# Patient Record
Sex: Female | Born: 1955
Health system: Southern US, Community
[De-identification: ages and names within clinical notes are randomized; demographics above are authoritative.]

## PROBLEM LIST (undated history)

## (undated) DIAGNOSIS — F32A Depression, unspecified: Secondary | ICD-10-CM

## (undated) DIAGNOSIS — L0390 Cellulitis, unspecified: Secondary | ICD-10-CM

## (undated) DIAGNOSIS — E7800 Pure hypercholesterolemia, unspecified: Secondary | ICD-10-CM

## (undated) DIAGNOSIS — R569 Unspecified convulsions: Secondary | ICD-10-CM

## (undated) DIAGNOSIS — G4730 Sleep apnea, unspecified: Secondary | ICD-10-CM

## (undated) DIAGNOSIS — J449 Chronic obstructive pulmonary disease, unspecified: Secondary | ICD-10-CM

## (undated) DIAGNOSIS — H548 Legal blindness, as defined in USA: Secondary | ICD-10-CM

## (undated) DIAGNOSIS — D249 Benign neoplasm of unspecified breast: Secondary | ICD-10-CM

## (undated) DIAGNOSIS — M5020 Other cervical disc displacement, unspecified cervical region: Secondary | ICD-10-CM

## (undated) DIAGNOSIS — F329 Major depressive disorder, single episode, unspecified: Secondary | ICD-10-CM

## (undated) DIAGNOSIS — F1911 Other psychoactive substance abuse, in remission: Secondary | ICD-10-CM

## (undated) DIAGNOSIS — R51 Headache: Principal | ICD-10-CM

## (undated) HISTORY — PX: BREAST EXCISIONAL BIOPSY: SUR124

## (undated) HISTORY — DX: Major depressive disorder, single episode, unspecified: F32.9

## (undated) HISTORY — DX: Cellulitis, unspecified: L03.90

## (undated) HISTORY — DX: Legal blindness, as defined in USA: H54.8

## (undated) HISTORY — DX: Headache: R51

## (undated) HISTORY — DX: Other cervical disc displacement, unspecified cervical region: M50.20

## (undated) HISTORY — DX: Benign neoplasm of unspecified breast: D24.9

## (undated) HISTORY — DX: Other psychoactive substance abuse, in remission: F19.11

## (undated) HISTORY — PX: BREAST FIBROADENOMA SURGERY: SHX580

## (undated) HISTORY — PX: TUBAL LIGATION: SHX77

## (undated) HISTORY — DX: Depression, unspecified: F32.A

## (undated) HISTORY — PX: NECK SURGERY: SHX720

---

## 1996-05-06 DIAGNOSIS — M5020 Other cervical disc displacement, unspecified cervical region: Secondary | ICD-10-CM

## 1996-05-06 HISTORY — DX: Other cervical disc displacement, unspecified cervical region: M50.20

## 1999-05-07 DIAGNOSIS — F1911 Other psychoactive substance abuse, in remission: Secondary | ICD-10-CM

## 1999-05-07 HISTORY — DX: Other psychoactive substance abuse, in remission: F19.11

## 2013-09-17 ENCOUNTER — Ambulatory Visit: Payer: Medicare Other | Admitting: Neurology

## 2013-10-05 ENCOUNTER — Encounter: Payer: Self-pay | Admitting: Neurology

## 2013-10-07 ENCOUNTER — Encounter: Payer: Self-pay | Admitting: Neurology

## 2013-10-07 ENCOUNTER — Ambulatory Visit (INDEPENDENT_AMBULATORY_CARE_PROVIDER_SITE_OTHER): Payer: Medicare Other | Admitting: Neurology

## 2013-10-07 ENCOUNTER — Encounter (INDEPENDENT_AMBULATORY_CARE_PROVIDER_SITE_OTHER): Payer: Self-pay

## 2013-10-07 VITALS — BP 111/72 | HR 96 | Ht 63.0 in | Wt 140.0 lb

## 2013-10-07 DIAGNOSIS — R51 Headache: Secondary | ICD-10-CM

## 2013-10-07 DIAGNOSIS — G4486 Cervicogenic headache: Secondary | ICD-10-CM

## 2013-10-07 HISTORY — DX: Cervicogenic headache: G44.86

## 2013-10-07 MED ORDER — GABAPENTIN 300 MG PO CAPS: ORAL_CAPSULE | ORAL | Status: DC

## 2013-10-07 NOTE — Progress Notes (Signed)
Reason for visit: Headache  Latoya Poole is a 58 y.o. female  History of present illness:  Latoya Poole is a 58 year old right-handed white female with a history of chronic daily headaches that have been present for greater than 20 years. She indicates that the headaches are left occipital in nature spreading more anteriorly in the head. The headaches are associated with a sharp or a throbbing pain. She reports that she has significant neck discomfort on the left side, without pain into the shoulder or down the arm. She has some neck stiffness and crepitus in the neck. She reports no weakness of the extremities, numbness of the extremities, balance problems, or problems controlling the bowels or the bladder. With the headache, there is no nausea or vomiting, dizziness, or visual changes. She has in the past taken excessive amounts of BC powders, but she suffered from aspirin toxicity on this. Topamax is not well-tolerated, and results in nausea and decreased appetite. The Topamax does have some beneficial effect with the headache. She has also been on Cymbalta taking 60 mg daily for number of years. She currently is not taking anything for the headache. She is sent to this office for an evaluation.  Past Medical History  Diagnosis Date  . Depressive disorder   . Cellulitis   . Herniated disc, cervical 1998  . Breast fibroadenoma     left  . 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 H/O: substance abuse 2001    xanax  . Cervicogenic headache 10/07/2013    Past Surgical History  Procedure Laterality Date  . Breast fibroadenoma surgery    . Neck surgery    . Tubal ligation Bilateral     Family History  Problem Relation Age of Onset  . Cancer Mother   . Cirrhosis Father     Social history:  reports that she has been smoking Cigarettes.  She has a 30 pack-year smoking history. She has never used smokeless tobacco. She reports that she does not drink  alcohol or use illicit drugs.  Medications:  Current Outpatient Prescriptions on File Prior to Visit  Medication Sig Dispense Refill  . diazepam (VALIUM) 10 MG tablet Take 10 mg by mouth every 8 (eight) hours as needed for anxiety.      . DULoxetine (CYMBALTA) 60 MG capsule Take 60 mg by mouth daily.      . rosuvastatin (CRESTOR) 10 MG tablet Take 10 mg by mouth daily.      . traZODone (DESYREL) 150 MG tablet Take 150 mg by mouth at bedtime.       No current facility-administered medications on file prior to visit.      Allergies  Allergen Reactions  . Sulfa Antibiotics Hives    ROS:  Out of a complete 14 system review of symptoms, the patient complains only of the following symptoms, and all other reviewed systems are negative.  Weight loss Skin rash Easy bruising Headache Depression, anxiety, insomnia  Blood pressure 111/72, pulse 96, height 5\' 3"  (1.6 m), weight 140 lb (63.504 kg).  Physical Exam  General: The patient is alert and cooperative at the time of the examination.  Eyes: Pupils are anisocoric, 6-7 mm, trace reactive on the right, 3-4 mm, reactive on the left. Discs are flat bilaterally.  Neck: The neck is supple, no carotid bruits are noted.  Respiratory: The respiratory examination is clear.  Cardiovascular: The cardiovascular examination reveals a regular  rate and rhythm, no obvious murmurs or rubs are noted.  Neuromuscular: Range of movement of the cervical spine is reduced by 30 with turning head to the left, 20 with turning the head to the right.  Skin: Extremities are without significant edema.  Neurologic Exam  Mental status: The patient is alert and oriented x 3 at the time of the examination. The patient has apparent normal recent and remote memory, with an apparently normal attention span and concentration ability.  Cranial nerves: Facial symmetry is present. There is good sensation of the face to pinprick and soft touch bilaterally. The  strength of the facial muscles and the muscles to head turning and shoulder shrug are normal bilaterally. Speech is well enunciated, no aphasia or dysarthria is noted. Extraocular movements are full. Visual fields are full, but vision is decreased in the right eye.. The tongue is midline, and the patient has symmetric elevation of the soft palate. No obvious hearing deficits are noted.  Motor: The motor testing reveals 5 over 5 strength of all 4 extremities. Good symmetric motor tone is noted throughout.  Sensory: Sensory testing is intact to pinprick, soft touch, vibration sensation, and position sense on all 4 extremities. No evidence of extinction is noted.  Coordination: Cerebellar testing reveals good finger-nose-finger and heel-to-shin bilaterally.  Gait and station: Gait is normal. Tandem gait is normal. Romberg is negative. No drift is seen.  Reflexes: Deep tendon reflexes are symmetric and normal bilaterally. Toes are downgoing bilaterally.   Assessment/Plan:  1. Cervicogenic headache  The patient has a history of prior cervical spine surgery, and she has chronic daily headaches coming up from the left side of the head and neck. The patient will be sent for MRI evaluation of the cervical spine. She will be placed on gabapentin. In the future, the Cymbalta may be increased. The patient will be sent for physical therapy for neuromuscular therapy. The patient should develop a home regimen to potentiate the benefit from therapy. She will followup in about 3 or 4 months.  Jill Alexanders MD 10/07/2013 3:28 PM  [facility] [address] [place], [place] [address]  Phone [phone] Fax [phone]

## 2013-10-07 NOTE — Patient Instructions (Signed)

## 2013-10-14 ENCOUNTER — Ambulatory Visit
Admission: RE | Admit: 2013-10-14 | Discharge: 2013-10-14 | Disposition: A | Discharge status: Home / Self Care | Payer: Medicare Other | Source: Ambulatory Visit | Attending: Neurology | Admitting: Neurology

## 2013-10-14 DIAGNOSIS — G4486 Cervicogenic headache: Secondary | ICD-10-CM

## 2013-10-14 DIAGNOSIS — R51 Headache: Secondary | ICD-10-CM

## 2013-10-18 ENCOUNTER — Telehealth: Payer: Self-pay | Admitting: Neurology

## 2013-10-18 MED ORDER — PREGABALIN 50 MG PO CAPS: 50.0000 mg | ORAL_CAPSULE | Freq: Two times a day (BID) | ORAL | Status: DC

## 2013-10-18 NOTE — Telephone Encounter (Signed)
I called patient. The MRI of the cervical spine shows minimal degenerative changes, most prominent at the C5-6 level. No surgically amenable lesions. The patient is to engage in physical therapy, on gabapentin, Cymbalta can be increased in the future.   MRI cervical spine 10/17/13:  Impression   Abnormal MRI scan of the cervical spine showing mild disc  degenerative changes most prominent at C5-6 where there is loss of disc  height and mild subluxation but without frank compression.

## 2013-10-18 NOTE — Telephone Encounter (Signed)
Patient returning call to Dr. Jannifer Franklin regarding MRI results, also has a question about Neurontin medication, states it has been making her feet swell a lot and she can't get her shoes on. Patient is wondering if she can try a different medication. Please call and advise.

## 2013-10-18 NOTE — Telephone Encounter (Signed)
Pt calling stating that she missed Dr. Jannifer Franklin call concerning her MRI results and that she would like try another medication other than gabapentin, because pt states that this medication makes her feet swell a lot. Please advise

## 2013-10-18 NOTE — Telephone Encounter (Signed)
I called patient. The patient is getting excessive swelling in ankles on gabapentin. We will stop the medication, try low-dose Lyrica. The patient indicates that she was on Cymbalta 60 mg twice daily, she was cut back to just one a day. There was no increase in the headache with this change.

## 2013-10-19 ENCOUNTER — Telehealth: Payer: Self-pay | Admitting: Neurology

## 2013-10-19 NOTE — Telephone Encounter (Signed)
We have confirmation the pharmacy received this Rx yesterday at 4:10pm.  I called the pharmacy.  Spoke with Estill Bamberg.  She said they are in the middle of processing the Rx at this time.  I called the patient back.  She is aware.

## 2013-10-19 NOTE — Telephone Encounter (Signed)
Pt called because the Lyrica is not at the pharmacy please call

## 2013-10-22 ENCOUNTER — Telehealth: Payer: Self-pay | Admitting: Neurology

## 2013-10-22 MED ORDER — HYDROCHLOROTHIAZIDE 12.5 MG PO TABS: 12.5000 mg | ORAL_TABLET | Freq: Every day | ORAL | Status: DC

## 2013-10-22 NOTE — Telephone Encounter (Signed)
Pt called states that she is having swelling in her feet and legs, she wants to see if Dr. Jannifer Franklin would call her in something for the swelling before she starts the Lyrica. Please call pt back concerning this matter. Thanks

## 2013-10-22 NOTE — Telephone Encounter (Signed)
The patient actually is on Lyrica, and the swelling has continued. I will try a low dose of hydrochlorothiazide.

## 2013-10-22 NOTE — Telephone Encounter (Signed)
I called back.  The patient said she has discontinued Gabapentin, but has not started taking Lyrica yet due to excessive swelling in her feet and legs.  She would like to know if something could be prescribed to reduce swelling.  Please advise.  Thank you.

## 2013-10-25 ENCOUNTER — Telehealth: Payer: Self-pay | Admitting: Neurology

## 2013-10-25 MED ORDER — QUETIAPINE FUMARATE 50 MG PO TABS: 50.0000 mg | ORAL_TABLET | Freq: Every day | ORAL | Status: DC

## 2013-10-25 NOTE — Addendum Note (Signed)
Addended by: Margette Fast on: 10/25/2013 05:58 PM   Modules accepted: Orders

## 2013-10-25 NOTE — Telephone Encounter (Addendum)
The patient is still swelling on the Lyrica, we will stop the Lyrica and the hydrochlorothiazide, try Seroquel. The patient is unable to take doses of Cymbalta higher than 60 mg daily.

## 2013-10-25 NOTE — Telephone Encounter (Signed)
Pt called states she has been taking the medication hydrochlorothiazide (HYDRODIURIL) 12.5 MG tablet, states her feet and legs are still swollen. Pt would like for Dr. Jannifer Franklin to call her back concerning this matter. Thanks

## 2013-10-28 ENCOUNTER — Ambulatory Visit: Payer: Medicare Other | Attending: Neurology

## 2013-10-28 DIAGNOSIS — M542 Cervicalgia: Secondary | ICD-10-CM | POA: Diagnosis not present

## 2013-10-28 DIAGNOSIS — R293 Abnormal posture: Secondary | ICD-10-CM | POA: Diagnosis not present

## 2013-10-28 DIAGNOSIS — IMO0001 Reserved for inherently not codable concepts without codable children: Secondary | ICD-10-CM | POA: Diagnosis present

## 2013-11-09 ENCOUNTER — Telehealth: Payer: Self-pay | Admitting: Neurology

## 2013-11-09 NOTE — Telephone Encounter (Signed)
Patient questioning if PT a 1 x event?  PT has her scheduled for 3 visits.  Please all and advise

## 2013-11-10 ENCOUNTER — Ambulatory Visit: Payer: Medicare Other | Attending: Neurology

## 2013-11-10 DIAGNOSIS — R293 Abnormal posture: Secondary | ICD-10-CM | POA: Insufficient documentation

## 2013-11-10 DIAGNOSIS — IMO0001 Reserved for inherently not codable concepts without codable children: Secondary | ICD-10-CM | POA: Diagnosis not present

## 2013-11-10 DIAGNOSIS — M542 Cervicalgia: Secondary | ICD-10-CM | POA: Insufficient documentation

## 2013-11-10 NOTE — Telephone Encounter (Signed)
Called pt and left message for pt to call our office back.

## 2013-11-16 NOTE — Telephone Encounter (Signed)
Called pt and pt stated that she had already decided just to take the three PT visits. I advised the pt that if she has any other problems, questions or concerns to call the office. Pt verbalized understanding.

## 2013-11-17 ENCOUNTER — Ambulatory Visit: Payer: Medicare Other

## 2013-11-17 DIAGNOSIS — IMO0001 Reserved for inherently not codable concepts without codable children: Secondary | ICD-10-CM | POA: Diagnosis not present

## 2013-11-23 ENCOUNTER — Ambulatory Visit: Payer: Medicare Other | Admitting: Physical Therapy

## 2013-11-23 DIAGNOSIS — IMO0001 Reserved for inherently not codable concepts without codable children: Secondary | ICD-10-CM | POA: Diagnosis not present

## 2013-11-29 ENCOUNTER — Telehealth: Payer: Self-pay | Admitting: Neurology

## 2013-11-29 ENCOUNTER — Ambulatory Visit: Payer: Medicare Other | Admitting: Physical Therapy

## 2013-11-29 DIAGNOSIS — IMO0001 Reserved for inherently not codable concepts without codable children: Secondary | ICD-10-CM | POA: Diagnosis not present

## 2013-11-29 MED ORDER — TRAMADOL HCL 50 MG PO TABS: 50.0000 mg | ORAL_TABLET | Freq: Four times a day (QID) | ORAL | Status: DC | PRN

## 2013-11-29 MED ORDER — QUETIAPINE FUMARATE 100 MG PO TABS: 100.0000 mg | ORAL_TABLET | Freq: Every day | ORAL | Status: DC

## 2013-11-29 NOTE — Telephone Encounter (Signed)
FYI: Seroquel 50 mg not working she takes 1qhs. Also patient requesting rx for pain be sent to [facility]. She had PT today and her neck is hurting.

## 2013-11-29 NOTE — Telephone Encounter (Signed)
The patient is on 50 mg of Seroquel at night, we will increase this to 100 mg. The patient is getting some physical therapy. I will call in some Ultram to take if needed for pain.

## 2013-11-29 NOTE — Telephone Encounter (Signed)
Patient calling requesting prescription for muscle pain in her neck. She had physical therapy today and is in a lot of pain. She stated that Seroquel is not working for her. She use [facility] [phone]

## 2013-12-02 ENCOUNTER — Ambulatory Visit: Payer: Medicare Other | Admitting: Physical Therapy

## 2013-12-02 DIAGNOSIS — IMO0001 Reserved for inherently not codable concepts without codable children: Secondary | ICD-10-CM | POA: Diagnosis not present

## 2013-12-09 ENCOUNTER — Telehealth: Payer: Self-pay | Admitting: Neurology

## 2013-12-09 MED ORDER — DICLOFENAC SODIUM 25 MG PO TBEC: 25.0000 mg | DELAYED_RELEASE_TABLET | Freq: Two times a day (BID) | ORAL | Status: DC

## 2013-12-09 NOTE — Telephone Encounter (Signed)
Trial of voltaren , to be taken with food, not on empty stomach.

## 2013-12-09 NOTE — Telephone Encounter (Signed)
Patient calling to state that she has been to physical therapy and has been doing the neck exercises at home but she is still in pain, states that the pain was only on left side but now it is on right side, patient requesting something stronger than Ultram for relief. Please return call to patient and advise.

## 2013-12-09 NOTE — Telephone Encounter (Signed)
Noted  

## 2013-12-09 NOTE — Telephone Encounter (Signed)
I called the patient.  She is aware.

## 2013-12-09 NOTE — Telephone Encounter (Signed)
Patient does not have voicemail set up on mobile number so please leave voicemail only on home number.

## 2013-12-09 NOTE — Telephone Encounter (Signed)
Patient has been going to physical therapy and doing neck exercises. She is taking Ultram 50 mg q6 hours. She says it is not relieving her pain and she would like something stronger.Please advise.

## 2013-12-20 ENCOUNTER — Telehealth: Payer: Self-pay | Admitting: Neurology

## 2013-12-20 NOTE — Telephone Encounter (Signed)
Latoya Poole DOB January 24, 2056 is calling--patient has been taking Diclofenac since 12-09-13 for neck pain and headaches--medication is not helping the pain--please call and advise--thank you.

## 2013-12-22 MED ORDER — TIZANIDINE HCL 2 MG PO TABS: ORAL_TABLET | ORAL | Status: DC

## 2013-12-22 NOTE — Telephone Encounter (Signed)
Patient calling again to check on the status, says she would like some relief for the pain, please return call and advise.

## 2013-12-22 NOTE — Telephone Encounter (Signed)
I called patient. She is having increasing neck and back pain after picking up a 10 pound bag of sugar in the last week or 2. The patient indicates that she has not been able to gain good improvement with her current medications. Diclofenac did not help. I will try tizanidine as a muscle relaxant, massage in the past has helped her with physical therapy, but she is no longer in physical therapy. The patient could potentially benefit from a cervical epidural steroid injection, but she does not wish to do this at this point. I will try tizanidine at this time.

## 2013-12-28 ENCOUNTER — Telehealth: Payer: Self-pay | Admitting: Neurology

## 2013-12-28 MED ORDER — OMEPRAZOLE 20 MG PO CPDR: 20.0000 mg | DELAYED_RELEASE_CAPSULE | Freq: Every day | ORAL | Status: DC

## 2013-12-28 NOTE — Telephone Encounter (Signed)
Spoke to patient and she relayed that she continues to have neck pain and headaches.  The Ultram was making her nauseous, not able to eat and drink.  She stated that Dr. Brigitte Pulse had prescribed Prilosec in the past that was helpful, but she needs an appointment with him to get a refill.  Asked if Dr. Jannifer Franklin can prescribe this medicine.

## 2013-12-28 NOTE — Telephone Encounter (Signed)
I called patient. She wants our office to prescribe Prilosec. I will call in a prescription.

## 2013-12-31 ENCOUNTER — Telehealth: Payer: Self-pay | Admitting: Neurology

## 2013-12-31 ENCOUNTER — Encounter: Payer: Self-pay | Admitting: Neurology

## 2013-12-31 MED ORDER — HYDROCODONE-ACETAMINOPHEN 5-325 MG PO TABS: 1.0000 | ORAL_TABLET | Freq: Four times a day (QID) | ORAL | Status: DC | PRN

## 2013-12-31 NOTE — Telephone Encounter (Signed)
Zanaflex Tizanidine 4 mg   [phone]

## 2013-12-31 NOTE — Telephone Encounter (Signed)
Patient requesting a mild hydrocodone be prescribed for her migraines and her neck pain. She uses [facility]. Her best call back number is [phone].

## 2013-12-31 NOTE — Telephone Encounter (Signed)
Will call in a small prescription for hydrocodone, no more than 30 a month.

## 2013-12-31 NOTE — Telephone Encounter (Signed)
Patient requesting a mild hydrocodone be prescribed for her migraines and her neck pain. She uses [facility]. Her best call back number is [phone]

## 2014-01-03 ENCOUNTER — Telehealth: Payer: Self-pay | Admitting: *Deleted

## 2014-01-03 NOTE — Telephone Encounter (Signed)
Left message for patient to pick up medication at the front desk of office.  I relayed through VM that this medication cannot be called in to pharmacy and must be picked up at the office.

## 2014-01-03 NOTE — Telephone Encounter (Signed)
Pt calling asking for MRI C spine to be sent to Dr. Carloyn Manner in [place] fax [phone].

## 2014-01-05 ENCOUNTER — Telehealth: Payer: Self-pay | Admitting: Neurology

## 2014-01-05 NOTE — Telephone Encounter (Signed)
Patient requesting Rx for Hydrocodone

## 2014-01-17 ENCOUNTER — Telehealth: Payer: Self-pay | Admitting: Neurology

## 2014-01-17 DIAGNOSIS — M47812 Spondylosis without myelopathy or radiculopathy, cervical region: Secondary | ICD-10-CM

## 2014-01-17 NOTE — Telephone Encounter (Signed)
Patient requesting that a referral be sent to Dr. Carloyn Manner in [place], his office number is [phone].

## 2014-01-18 NOTE — Telephone Encounter (Signed)
Latoya Poole want a referral to Dr. Carloyn Manner in [place] (neurosurgeon). She wants him to look at her MRI and give a second opinion.

## 2014-01-18 NOTE — Telephone Encounter (Signed)
I called patient. The patient wishes to see Dr. Carloyn Manner in [place]. I indicated to her that the MRI the cervical spine shows evidence of spasm, but nothing that would require surgery. The patient just wants a second opinion regarding her MRI of the cervical spine. I'll get a referral set up.

## 2014-02-08 ENCOUNTER — Encounter: Payer: Self-pay | Admitting: Adult Health

## 2014-02-08 ENCOUNTER — Ambulatory Visit (INDEPENDENT_AMBULATORY_CARE_PROVIDER_SITE_OTHER): Payer: Medicare Other | Admitting: Adult Health

## 2014-02-08 ENCOUNTER — Encounter (INDEPENDENT_AMBULATORY_CARE_PROVIDER_SITE_OTHER): Payer: Self-pay

## 2014-02-08 VITALS — BP 116/75 | HR 89 | Ht 63.0 in | Wt 170.0 lb

## 2014-02-08 DIAGNOSIS — G4486 Cervicogenic headache: Secondary | ICD-10-CM

## 2014-02-08 DIAGNOSIS — R51 Headache: Secondary | ICD-10-CM

## 2014-02-08 MED ORDER — HYDROCODONE-ACETAMINOPHEN 5-325 MG PO TABS: 1.0000 | ORAL_TABLET | Freq: Four times a day (QID) | ORAL | Status: DC | PRN

## 2014-02-08 NOTE — Progress Notes (Signed)
I have read the note, and I agree with the clinical assessment and plan.  Josaiah Muhammed KEITH   

## 2014-02-08 NOTE — Progress Notes (Addendum)
PATIENT: Latoya Poole DOB: 08-Jan-1956  REASON FOR VISIT: follow up HISTORY FROM: patient  HISTORY OF PRESENT ILLNESS: Latoya Poole is a 58 year old female with a history of chronic daily headaches. She returns today for follow-up. She is currently taking Vicodin for headache and neck pain. She has tried tramadol, tizanidine and voltaren without benefit. She states that the Vicodin helps. She states that the neck pain really became a problem after she fell in December. Ex-Husband states that she has always had headaches since he has known her. She has been to neuromuscular therapy six times and had some benefit. She continues to do the exercises at home. She continues to take Seroquel, lyrica and Vicodin and that combination has given her the most relief. She continues to have daily neck pain but headaches have improved.                                       HISTORY 10/07/13 (CW): 58 year old right-handed white female with a history of chronic daily headaches that have been present for greater than 20 years. She indicates that the headaches are left occipital in nature spreading more anteriorly in the head. The headaches are associated with a sharp or a throbbing pain. She reports that she has significant neck discomfort on the left side, without pain into the shoulder or down the arm. She has some neck stiffness and crepitus in the neck. She reports no weakness of the extremities, numbness of the extremities, balance problems, or problems controlling the bowels or the bladder. With the headache, there is no nausea or vomiting, dizziness, or visual changes. She has in the past taken excessive amounts of BC powders, but she suffered from aspirin toxicity on this. Topamax is not well-tolerated, and results in nausea and decreased appetite. The Topamax does have some beneficial effect with the headache. She has also been on Cymbalta taking 60 mg daily for number of years. She currently is not taking anything  for the headache. She is sent to this office for an evaluation.  REVIEW OF SYSTEMS: Full 14 system review of systems performed and notable only for:  Constitutional: Appetite change  Eyes: Loss of vision Ear/Nose/Throat: N/A  Skin: N/A  Cardiovascular: N/A  Respiratory: N/A  Gastrointestinal: N/A  Genitourinary: N/A Hematology/Lymphatic: N/A  Endocrine: N/A Musculoskeletal: Aching muscles, neck pain, neck stiffness Allergy/Immunology: N/A  Neurological: Headache Psychiatric: Depression, nervous/anxious Sleep: Restless leg   ALLERGIES: Allergies  Allergen Reactions  . Gabapentin     Leg swelling  . Lyrica [Pregabalin]     Leg swelling  . Sulfa Antibiotics Hives    HOME MEDICATIONS: Outpatient Prescriptions Prior to Visit  Medication Sig Dispense Refill  . diazepam (VALIUM) 10 MG tablet Take 10 mg by mouth every 8 (eight) hours as needed for anxiety.      . DULoxetine (CYMBALTA) 60 MG capsule Take 60 mg by mouth daily.      Marland Kitchen HYDROcodone-acetaminophen (NORCO/VICODIN) 5-325 MG per tablet Take 1 tablet by mouth every 6 (six) hours as needed for moderate pain. Must last 28 days  30 tablet  0  . omeprazole (PRILOSEC) 20 MG capsule Take 1 capsule (20 mg total) by mouth daily.  30 capsule  5  . QUEtiapine (SEROQUEL) 100 MG tablet Take 1 tablet (100 mg total) by mouth at bedtime.  30 tablet  3  . rosuvastatin (CRESTOR) 10 MG  tablet Take 10 mg by mouth daily.      . traZODone (DESYREL) 150 MG tablet Take 150 mg by mouth at bedtime.      . diclofenac (VOLTAREN) 25 MG EC tablet Take 1 tablet (25 mg total) by mouth 2 (two) times daily.  30 tablet  0  . tiZANidine (ZANAFLEX) 2 MG tablet 1 tablet twice daily for 5 days, then take one tablet 3 times daily  90 tablet  2  . traMADol (ULTRAM) 50 MG tablet Take 1 tablet (50 mg total) by mouth every 6 (six) hours as needed. Must last 28 days  60 tablet  1   No facility-administered medications prior to visit.    PAST MEDICAL HISTORY: Past  Medical History  Diagnosis Date  . Depressive disorder   . Cellulitis   . Herniated disc, cervical 1998  . Breast fibroadenoma     left  . 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 H/O: substance abuse 2001    xanax  . Cervicogenic headache 10/07/2013    PAST SURGICAL HISTORY: Past Surgical History  Procedure Laterality Date  . Breast fibroadenoma surgery    . Neck surgery    . Tubal ligation Bilateral     FAMILY HISTORY: Family History  Problem Relation Age of Onset  . Cancer Mother   . Cirrhosis Father     SOCIAL HISTORY: History   Social History  . Marital Status: Divorced    Spouse Name: N/A    Number of Children: 2  . Years of Education: 11th   Occupational History  . disabiltiy    Social History Main Topics  . Smoking status: Current Every Day Smoker -- 1.00 packs/day for 30 years    Types: Cigarettes  . Smokeless tobacco: Never Used     Comment: Oct 01, 2013  . Alcohol Use: No     Comment: former alcohol abuse quit 2001  . Drug Use: No  . Sexual Activity: Not on file   Other Topics Concern  . Not on file   Social History Narrative   Patient lives at home with son.    Patient has 2 children.    Patient on disability.    Patient is right handed.    Patient is divorced.       PHYSICAL EXAM  Filed Vitals:   02/08/14 1434  BP: 116/75  Pulse: 89  Height: 5\' 3"  (1.6 m)  Weight: 170 lb (77.111 kg)   Body mass index is 30.12 kg/(m^2).  Generalized: Well developed, in no acute distress   Neurological examination  Mentation: Alert oriented to time, place, history taking. Follows all commands speech and language fluent Cranial nerve II-XII:  Left Pupil is equal round reactive to light. Blind in right eye and Pupil is irregularly and non reactive. Extraocular movements were full, visual field were full on confrontational test. Facial sensation and strength were normal. . Uvula tongue midline. Head turning and shoulder  shrug  were normal and symmetric. Motor: The motor testing reveals 5 over 5 strength of all 4 extremities. Good symmetric motor tone is noted throughout.  Sensory: Sensory testing is intact to soft touch on all 4 extremities. No evidence of extinction is noted.  Coordination: Cerebellar testing reveals good finger-nose-finger and heel-to-shin bilaterally.  Gait and station: Gait is normal. Tandem gait is normal. Romberg is negative. No drift is seen.  Reflexes: Deep tendon reflexes are symmetric and normal bilaterally.  Marland Kitchen  DIAGNOSTIC DATA (LABS, IMAGING, TESTING) - I reviewed patient records, labs, notes, testing and imaging myself where available.  ASSESSMENT AND PLAN 59 y.o. year old female  has a past medical history of Depressive disorder; Cellulitis; Herniated disc, cervical (1998); Breast fibroadenoma; Legally blind in right eye, as defined in [place]; H/O: substance abuse (2001); and Cervicogenic headache (10/07/2013). here with:  1. Cervicogenic headache  Patient's headache and neck pain have improved with Seroquel, lyrica and Vicodin. I will refill the Vicodin today. I have asked the patient to sign a narcotic agreement. I have when over the agreement with the patient and she agreed to the terms. If the patient's symptoms worsen or if she develops new symptoms then she should let us know. Otherwise the patient will follow-up in 3 months.    Ward Givens, MSN, NP-C 02/08/2014, 2:37 PM [facility] [address], [place], [place] [address] [phone]  Note: This document was prepared with digital dictation and possible smart phrase technology. Any transcriptional errors that result from this process are unintentional.

## 2014-02-08 NOTE — Patient Instructions (Signed)

## 2014-03-15 ENCOUNTER — Other Ambulatory Visit: Payer: Self-pay

## 2014-03-15 MED ORDER — PREGABALIN 50 MG PO CAPS: 50.0000 mg | ORAL_CAPSULE | Freq: Two times a day (BID) | ORAL | Status: DC

## 2014-03-15 NOTE — Telephone Encounter (Signed)
Rx signed and faxed.

## 2014-03-17 ENCOUNTER — Telehealth: Payer: Self-pay | Admitting: Neurology

## 2014-03-17 MED ORDER — QUETIAPINE FUMARATE 100 MG PO TABS: 100.0000 mg | ORAL_TABLET | Freq: Every day | ORAL | Status: DC

## 2014-03-17 NOTE — Telephone Encounter (Signed)
Lyrica was already faxed to the pharmacy (pleas see previous encounter).  Seroquel has now been sent.

## 2014-03-17 NOTE — Telephone Encounter (Signed)
Patient calling to request refills of Lyrica and Seroquel to be sent to her [facility], advised patient to call her pharmacy directly next time and they will contact us.

## 2014-03-23 ENCOUNTER — Encounter: Payer: Self-pay | Admitting: Neurology

## 2014-03-23 ENCOUNTER — Other Ambulatory Visit: Payer: Self-pay | Admitting: Adult Health

## 2014-03-23 MED ORDER — HYDROCODONE-ACETAMINOPHEN 5-325 MG PO TABS: 1.0000 | ORAL_TABLET | Freq: Four times a day (QID) | ORAL | Status: DC | PRN

## 2014-03-23 NOTE — Telephone Encounter (Addendum)
Pt needs a Rx for HYDROcodone-acetaminophen (NORCO/VICODIN) 5-325 MG per tablet. Please call and advise.

## 2014-03-23 NOTE — Telephone Encounter (Signed)
Request forwarded to provider for approval  

## 2014-03-24 NOTE — Telephone Encounter (Signed)
I called the patient to let them know their Rx for Hydrocodone was ready for pickup. Patient was instructed to bring Photo ID. 

## 2014-03-29 ENCOUNTER — Encounter: Payer: Self-pay | Admitting: Neurology

## 2014-04-22 NOTE — Telephone Encounter (Signed)
Patient was seen 02/03/14

## 2014-05-03 ENCOUNTER — Telehealth: Payer: Self-pay

## 2014-05-03 ENCOUNTER — Other Ambulatory Visit: Payer: Self-pay

## 2014-05-03 MED ORDER — HYDROCODONE-ACETAMINOPHEN 5-325 MG PO TABS: 1.0000 | ORAL_TABLET | Freq: Four times a day (QID) | ORAL | Status: DC | PRN

## 2014-05-03 NOTE — Telephone Encounter (Signed)
Request entered, forwarded to provider for approval.  

## 2014-05-03 NOTE — Telephone Encounter (Signed)
Hydrochlorothiazide was initiated by Dr. Jannifer Franklin due to swelling in the extremities from Lyrica. If she is no longer having swelling and is only taking the medication as needed then this medication may no longer be indicated. Hydrochlorothiazide will not help her neck pain. Please clarify with the patient. If she is no longer having swelling in her extremities and has not been taking the medication. Then she no longer needs it. Thanks.

## 2014-05-03 NOTE — Telephone Encounter (Signed)
Pharmacy is requesting refill on Hydrochlorothiazide.  Per phone note from 06/22, patient was to discontinue this med.  I called the patient.  Says she has continued to take this med on an as needed basis only.  She would like to continue this drug if provider is agreeable.  Says the swelling in her extremities has subsided, however, she is still having pain in her neck, and feels one side of her head is "swollen".  Last seen in Oct, has follow up in April.  Please advise.  Thank you.

## 2014-05-03 NOTE — Telephone Encounter (Signed)
I called the patent back.  Says she just realized she had her medications mixed up.  She mistook HCTZ with a different drug she was taking as needed.  Says she hasn't really had issues with swelling, but if they do arise, she will call us back and let us know.

## 2014-05-04 NOTE — Telephone Encounter (Signed)
I called the patient to let them know their Rx for Hydrocodone was ready for pickup. Patient was instructed to bring Photo ID.

## 2014-05-30 ENCOUNTER — Other Ambulatory Visit: Payer: Self-pay | Admitting: Neurology

## 2014-05-31 NOTE — Telephone Encounter (Signed)
Per note on 06/22, this med was discontinued.  I called patient at home, got no answer, no VM.  Called cell, got no answer.  Left message.

## 2014-06-01 NOTE — Telephone Encounter (Signed)
I spoke with patient who said she has discontinued this medication

## 2014-06-08 ENCOUNTER — Other Ambulatory Visit: Payer: Self-pay | Admitting: *Deleted

## 2014-06-08 ENCOUNTER — Telehealth: Payer: Self-pay

## 2014-06-08 MED ORDER — HYDROCODONE-ACETAMINOPHEN 5-325 MG PO TABS: 1.0000 | ORAL_TABLET | Freq: Four times a day (QID) | ORAL | Status: DC | PRN

## 2014-06-08 NOTE — Telephone Encounter (Signed)
Called patient and informed Rx ready for pick up at front desk. Patient verbalized understanding.  

## 2014-06-08 NOTE — Telephone Encounter (Signed)
Request entered, forwarded to provider for approval.  

## 2014-06-08 NOTE — Telephone Encounter (Signed)
Patient calling wanting a refill

## 2014-07-08 ENCOUNTER — Other Ambulatory Visit: Payer: Self-pay | Admitting: *Deleted

## 2014-07-08 NOTE — Telephone Encounter (Signed)
Patient is aware that physician is out of the office today.

## 2014-07-11 ENCOUNTER — Telehealth: Payer: Self-pay

## 2014-07-11 MED ORDER — HYDROCODONE-ACETAMINOPHEN 5-325 MG PO TABS: 1.0000 | ORAL_TABLET | Freq: Four times a day (QID) | ORAL | Status: DC | PRN

## 2014-07-11 NOTE — Telephone Encounter (Signed)
Called patient and informed Rx ready for pick up at front desk. Patient verbalized understanding.  

## 2014-07-28 ENCOUNTER — Other Ambulatory Visit: Payer: Self-pay | Admitting: Neurology

## 2014-07-28 NOTE — Telephone Encounter (Signed)
Per note on 08/25

## 2014-08-10 ENCOUNTER — Encounter: Payer: Self-pay | Admitting: Adult Health

## 2014-08-10 ENCOUNTER — Ambulatory Visit (INDEPENDENT_AMBULATORY_CARE_PROVIDER_SITE_OTHER): Payer: Medicare Other | Admitting: Adult Health

## 2014-08-10 VITALS — BP 155/88 | HR 81 | Ht 63.0 in | Wt 169.0 lb

## 2014-08-10 DIAGNOSIS — R51 Headache: Secondary | ICD-10-CM

## 2014-08-10 DIAGNOSIS — G4486 Cervicogenic headache: Secondary | ICD-10-CM

## 2014-08-10 DIAGNOSIS — Z5181 Encounter for therapeutic drug level monitoring: Secondary | ICD-10-CM | POA: Diagnosis not present

## 2014-08-10 MED ORDER — PREGABALIN 50 MG PO CAPS: 50.0000 mg | ORAL_CAPSULE | Freq: Every day | ORAL | Status: DC

## 2014-08-10 MED ORDER — HYDROCODONE-ACETAMINOPHEN 5-325 MG PO TABS: 1.0000 | ORAL_TABLET | Freq: Four times a day (QID) | ORAL | Status: DC | PRN

## 2014-08-10 NOTE — Patient Instructions (Signed)
We will slowly try to wean you off some of the medication. I will decrease lyrica to 1 tablet daily.  I will refill hydrocodone today. We will check a urine drug screen today.

## 2014-08-10 NOTE — Progress Notes (Signed)
PATIENT: Latoya Poole DOB: 1956/03/17  REASON FOR VISIT: follow up- headache, neck pain HISTORY FROM: patient  HISTORY OF PRESENT ILLNESS: Latoya Poole is a 59 year old female with a history of headaches and neck pain. She returns today for follow-up. The patient states that her headaches have improved. She states that she may have two dull headaches a month. She states that her neck pain has also significantly improved with the medication. She states that she can take the Seroquel Lyrica and Vicodin and that offers her significant benefit. The patient states that she continues to do her neck exercises at home. The patient denies any new neurological symptoms. Denies any new medical issues. She returns today for evaluation  HISTORY 02/08/14: Latoya Poole is a 59 year old female with a history of chronic daily headaches. She returns today for follow-up. She is currently taking Vicodin for headache and neck pain. She has tried tramadol, tizanidine and voltaren without benefit. She states that the Vicodin helps. She states that the neck pain really became a problem after she fell in December. Ex-Husband states that she has always had headaches since he has known her. She has been to neuromuscular therapy six times and had some benefit. She continues to do the exercises at home. She continues to take Seroquel, lyrica and Vicodin and that combination has given her the most relief. She continues to have daily neck pain but headaches have improved.    HISTORY 10/07/13 (CW): 59 year old right-handed white female with a history of chronic daily headaches that have been present for greater than 20 years. She indicates that the headaches are left occipital in nature spreading more anteriorly in the head. The headaches are associated with a sharp or a throbbing pain. She reports that she has significant neck discomfort on the left side, without pain into the shoulder or down the  arm. She has some neck stiffness and crepitus in the neck. She reports no weakness of the extremities, numbness of the extremities, balance problems, or problems controlling the bowels or the bladder. With the headache, there is no nausea or vomiting, dizziness, or visual changes. She has in the past taken excessive amounts of BC powders, but she suffered from aspirin toxicity on this. Topamax is not well-tolerated, and results in nausea and decreased appetite. The Topamax does have some beneficial effect with the headache. She has also been on Cymbalta taking 60 mg daily for number of years. She currently is not taking anything for the headache. She is sent to this office for an evaluation.  REVIEW OF SYSTEMS: Out of a complete 14 system review of symptoms, the patient complains only of the following symptoms, and all other reviewed systems are negative.  Cough, back pain, neck pain, neck stiffness, nervous/anxious  ALLERGIES: Allergies  Allergen Reactions  . Gabapentin     Leg swelling  . Lyrica [Pregabalin]     Leg swelling  . Sulfa Antibiotics Hives    HOME MEDICATIONS: Outpatient Prescriptions Prior to Visit  Medication Sig Dispense Refill  . diazepam (VALIUM) 10 MG tablet Take 10 mg by mouth every 8 (eight) hours as needed for anxiety.    . DULoxetine (CYMBALTA) 60 MG capsule Take 60 mg by mouth daily.    Marland Kitchen HYDROcodone-acetaminophen (NORCO/VICODIN) 5-325 MG per tablet Take 1 tablet by mouth every 6 (six) hours as needed for moderate pain. Must last 28 days 30 tablet 0  . omeprazole (PRILOSEC) 20 MG capsule TAKE 1 CAPSULE BY MOUTH DAILY. [place]  capsule 5  . pregabalin (LYRICA) 50 MG capsule Take 1 capsule (50 mg total) by mouth 2 (two) times daily. 60 capsule 5  . QUEtiapine (SEROQUEL) 100 MG tablet Take 1 tablet (100 mg total) by mouth at bedtime. 30 tablet 5  . rosuvastatin (CRESTOR) 10 MG tablet Take 10 mg by mouth daily.    . traZODone (DESYREL) 150 MG tablet Take 150 mg by mouth at  bedtime.     No facility-administered medications prior to visit.    PAST MEDICAL HISTORY: Past Medical History  Diagnosis Date  . Depressive disorder   . Cellulitis   . Herniated disc, cervical 1998  . Breast fibroadenoma     left  . 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 H/O: substance abuse 2001    xanax  . Cervicogenic headache 10/07/2013    PAST SURGICAL HISTORY: Past Surgical History  Procedure Laterality Date  . Breast fibroadenoma surgery    . Neck surgery    . Tubal ligation Bilateral     FAMILY HISTORY: Family History  Problem Relation Age of Onset  . Cancer Mother   . Cirrhosis Father     SOCIAL HISTORY: History   Social History  . Marital Status: Divorced    Spouse Name: N/A  . Number of Children: 2  . Years of Education: 11th   Occupational History  . disabiltiy    Social History Main Topics  . Smoking status: Current Every Day Smoker -- 1.00 packs/day for 30 years    Types: Cigarettes  . Smokeless tobacco: Never Used     Comment: Oct 01, 2013  . Alcohol Use: No     Comment: former alcohol abuse quit 2001  . Drug Use: No  . Sexual Activity: Not on file   Other Topics Concern  . Not on file   Social History Narrative   Patient lives at home with son.    Patient has 2 children.    Patient on disability.    Patient is right handed.    Patient is divorced.    Education 11 th .      PHYSICAL EXAM  Filed Vitals:   08/10/14 0931  BP: 155/88  Pulse: 81  Height: 5\' 3"  (1.6 m)  Weight: 169 lb (76.658 kg)   Body mass index is 29.94 kg/(m^2).  Generalized: Well developed, in no acute distress   Neurological examination  Mentation: Alert oriented to time, place, history taking. Follows all commands speech and language fluent Cranial nerve II-XII: left Pupil equal round reactive to light. Right pupil dilated due to previous injury. Extraocular movements were full, visual field were full on confrontational  test. Facial sensation and strength were normal. Uvula tongue midline. Head turning and shoulder shrug  were normal and symmetric. Motor: The motor testing reveals 5 over 5 strength of all 4 extremities. Good symmetric motor tone is noted throughout. Mild pain with range of motion of the neck. Sensory: Sensory testing is intact to soft touch on all 4 extremities. No evidence of extinction is noted.  Coordination: Cerebellar testing reveals good finger-nose-finger and heel-to-shin bilaterally.  Gait and station: Gait is normal. Tandem gait is normal. Romberg is negative. No drift is seen.  Reflexes: Deep tendon reflexes are symmetric and normal bilaterally.    DIAGNOSTIC DATA (LABS, IMAGING, TESTING) - I reviewed patient records, labs, notes, testing and imaging myself where available.     ASSESSMENT AND PLAN 59  y.o. year old female  has a past medical history of Depressive disorder; Cellulitis; Herniated disc, cervical (1998); Breast fibroadenoma; Legally blind in right eye, as defined in [place]; H/O: substance abuse (2001); and Cervicogenic headache (10/07/2013). here with:  1. Headache 2. Neck pain  The patient's headache and neck pain has improved with medication. She currently uses Lyrica, Seroquel and hydrocodone and this has controlled her pain. I have spoken with the patient about decreasing her medication and possibly weaning her off some of this medication in the future. The patient is amenable to this. Today I will decrease her Lyrica to 1 tablet daily. In the future we may be able to wean this medication off. The patient is on hydrocodone I will check a urine drug screen today. I have advised this patient that if her symptoms worsen or she develops new symptoms she should let us know otherwise she will follow-up in 6 months or sooner if needed.     Ward Givens, MSN, NP-C 08/10/2014, 9:50 AM [facility] [address] [place], [place] [address] [phone]  Note: This document was prepared with digital dictation and possible smart phrase technology. Any transcriptional errors that result from this process are unintentional.

## 2014-08-10 NOTE — Progress Notes (Signed)
I have read the note, and I agree with the clinical assessment and plan.  WILLIS,CHARLES KEITH   

## 2014-08-10 NOTE — Addendum Note (Signed)
Addended by: Trudie Buckler on: 08/10/2014 01:08 PM   Modules accepted: Orders

## 2014-08-16 LAB — COMPREHENSIVE DRUG ANALYSIS,UR: PDF: 0

## 2014-08-16 NOTE — Progress Notes (Signed)
Quick Note:  Called and spoke to patient and relayed normal labs. Patient understood. ______

## 2014-08-29 ENCOUNTER — Other Ambulatory Visit: Payer: Self-pay | Admitting: Neurology

## 2014-08-29 NOTE — Telephone Encounter (Signed)
Per note from 07/27

## 2014-09-14 ENCOUNTER — Other Ambulatory Visit: Payer: Self-pay | Admitting: Neurology

## 2014-09-14 MED ORDER — HYDROCODONE-ACETAMINOPHEN 5-325 MG PO TABS: 1.0000 | ORAL_TABLET | Freq: Four times a day (QID) | ORAL | Status: DC | PRN

## 2014-10-17 ENCOUNTER — Telehealth: Payer: Self-pay

## 2014-10-17 ENCOUNTER — Other Ambulatory Visit: Payer: Self-pay | Admitting: Neurology

## 2014-10-17 MED ORDER — HYDROCODONE-ACETAMINOPHEN 5-325 MG PO TABS: 1.0000 | ORAL_TABLET | Freq: Four times a day (QID) | ORAL | Status: DC | PRN

## 2014-10-17 NOTE — Telephone Encounter (Signed)
Patient called and requested a refill on Rx. HYDROcodone-acetaminophen (NORCO/VICODIN) 5-325 MG per tablet. Informed the patient that it would be ready within 24 hours unless informed otherwise by the nurse.

## 2014-10-17 NOTE — Telephone Encounter (Signed)
Request entered, forwarded to provider for approval.  

## 2014-10-17 NOTE — Telephone Encounter (Signed)
Rx ready for pick up. 

## 2014-10-26 ENCOUNTER — Other Ambulatory Visit: Payer: Self-pay | Admitting: Neurology

## 2014-11-15 ENCOUNTER — Other Ambulatory Visit: Payer: Self-pay | Admitting: Neurology

## 2014-11-15 ENCOUNTER — Telehealth: Payer: Self-pay

## 2014-11-15 MED ORDER — HYDROCODONE-ACETAMINOPHEN 5-325 MG PO TABS: 1.0000 | ORAL_TABLET | Freq: Four times a day (QID) | ORAL | Status: DC | PRN

## 2014-11-15 NOTE — Telephone Encounter (Signed)
Pt needs Rx refill HYDROcodone-acetaminophen (NORCO/VICODIN) 5-325 MG per tablet, may reach pt at [phone]. Does not have voicemail.

## 2014-11-15 NOTE — Telephone Encounter (Signed)
Rx ready for pick up. 

## 2014-11-15 NOTE — Telephone Encounter (Signed)
Request entered, forwarded to provider for approval.  

## 2014-12-15 ENCOUNTER — Telehealth: Payer: Self-pay

## 2014-12-15 NOTE — Telephone Encounter (Signed)
I called the patient at home.  Line rang several times with no answer, no option to leave message.  I called cell.  Got no answer.  Left message.  Provider would like to verify the type of pain patient wants to use cream for.

## 2014-12-15 NOTE — Telephone Encounter (Signed)
Latoya Poole, pharmacist with [facility], would like to know if we will prescribe a pain cream for Latoya Poole.  Requesting: Lidocaine Oint 5%, Gabapentin 6% and Diclofenac 3%; 240 grams per fill, apply 1-2 grams to the affected area(s) three to four times daily.  Please advise.  Thank you.

## 2014-12-15 NOTE — Telephone Encounter (Signed)
Can you verify what type of pain she wants to use the cream for? I see her for cervicogenic headaches. Does she want to use the cream on her neck? If you can please call the patient and clarify

## 2014-12-19 ENCOUNTER — Other Ambulatory Visit: Payer: Self-pay | Admitting: Neurology

## 2014-12-19 MED ORDER — NONFORMULARY OR COMPOUNDED ITEM: Status: DC

## 2014-12-19 MED ORDER — HYDROCODONE-ACETAMINOPHEN 5-325 MG PO TABS: 1.0000 | ORAL_TABLET | Freq: Four times a day (QID) | ORAL | Status: DC | PRN

## 2014-12-19 NOTE — Telephone Encounter (Signed)
Pt called for rx refill on HYDROcodone-acetaminophen (NORCO/VICODIN) 5-325 MG per tablet.   Also states the cream is for her neck.   May call [phone]

## 2014-12-19 NOTE — Telephone Encounter (Signed)
I am amendable to this. I am unsure how to e-prescribe this?

## 2014-12-19 NOTE — Telephone Encounter (Signed)
Patient called back and said she would like to use the cream for pain she is having in her neck.

## 2014-12-19 NOTE — Telephone Encounter (Signed)
Rx has been added and sent it to the pharmacy.  I called the pharmacy and spoke with [facility].  He verified they will make this formulary, and they will notify the patient when it's ready for pick up.  I called the patient back as well, she is aware.

## 2014-12-20 ENCOUNTER — Telehealth: Payer: Self-pay

## 2014-12-20 NOTE — Telephone Encounter (Signed)
Rx ready for pick up. 

## 2015-01-18 ENCOUNTER — Other Ambulatory Visit: Payer: Self-pay | Admitting: Neurology

## 2015-01-18 NOTE — Telephone Encounter (Signed)
Per note on 08/25 

## 2015-01-30 ENCOUNTER — Telehealth: Payer: Self-pay | Admitting: Neurology

## 2015-01-30 NOTE — Telephone Encounter (Signed)
Patient is calling for a written Rx hydrocodone-acetaminophen 5/325 mg.  Thanks!

## 2015-01-31 ENCOUNTER — Other Ambulatory Visit: Payer: Self-pay | Admitting: Neurology

## 2015-01-31 ENCOUNTER — Telehealth: Payer: Self-pay

## 2015-01-31 MED ORDER — HYDROCODONE-ACETAMINOPHEN 5-325 MG PO TABS: 1.0000 | ORAL_TABLET | Freq: Four times a day (QID) | ORAL | Status: DC | PRN

## 2015-01-31 NOTE — Telephone Encounter (Signed)
Rx ready for pick up. 

## 2015-01-31 NOTE — Telephone Encounter (Signed)
A prescription was written for hydrocodone.

## 2015-02-14 ENCOUNTER — Ambulatory Visit (INDEPENDENT_AMBULATORY_CARE_PROVIDER_SITE_OTHER): Payer: Medicare Other | Admitting: Neurology

## 2015-02-14 ENCOUNTER — Encounter: Payer: Self-pay | Admitting: Neurology

## 2015-02-14 VITALS — BP 146/88 | HR 75 | Ht 63.0 in | Wt 166.0 lb

## 2015-02-14 DIAGNOSIS — G4486 Cervicogenic headache: Secondary | ICD-10-CM

## 2015-02-14 DIAGNOSIS — R51 Headache: Secondary | ICD-10-CM

## 2015-02-14 MED ORDER — PREGABALIN 50 MG PO CAPS: 50.0000 mg | ORAL_CAPSULE | Freq: Every day | ORAL | Status: DC

## 2015-02-14 MED ORDER — QUETIAPINE FUMARATE 100 MG PO TABS: 100.0000 mg | ORAL_TABLET | Freq: Every day | ORAL | Status: DC

## 2015-02-14 NOTE — Progress Notes (Signed)
Reason for visit: Headache  Latoya Poole is an 59 y.o. female  History of present illness:  Latoya Poole is a 59 year old right-handed white female with a history of cervical spondylosis and cervicogenic headache. The patient has done relatively well with the regimen of Lyrica and Seroquel that has allowed her to sleep, and this has helped her discomfort significant. The patient is not having any significant headaches during the month, and she is functioning fairly well. She notes that certain types of housework such as sweeping or mopping will worsen the pain. She denies any new symptoms since last seen. She is tolerating her medication regimen well. She returns for an evaluation.  Past Medical History  Diagnosis Date  . Depressive disorder   . Cellulitis   . Herniated disc, cervical 1998  . Breast fibroadenoma     left  . 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 H/O: substance abuse 2001    xanax  . Cervicogenic headache 10/07/2013    Past Surgical History  Procedure Laterality Date  . Breast fibroadenoma surgery    . Neck surgery    . Tubal ligation Bilateral     Family History  Problem Relation Age of Onset  . Cancer Mother   . Cirrhosis Father     Social history:  reports that she has been smoking Cigarettes.  She has a 30 pack-year smoking history. She has never used smokeless tobacco. She reports that she does not drink alcohol or use illicit drugs.    Allergies  Allergen Reactions  . Gabapentin     Leg swelling  . Lyrica [Pregabalin]     Leg swelling  . Sulfa Antibiotics Hives    Medications:  Prior to Admission medications   Medication Sig Start Date End Date Taking? Authorizing Provider  diazepam (VALIUM) 10 MG tablet Take 10 mg by mouth every 8 (eight) hours as needed for anxiety.   Yes Historical Provider, MD  DULoxetine (CYMBALTA) 60 MG capsule Take 60 mg by mouth daily.   Yes Historical Provider, MD    HYDROcodone-acetaminophen (NORCO/VICODIN) 5-325 MG per tablet Take 1 tablet by mouth every 6 (six) hours as needed for moderate pain. Must last 28 days 01/31/15  Yes Kathrynn Ducking, MD  NONFORMULARY OR COMPOUNDED ITEM Lidocaine Oint 5%, Gabapentin 6% and Diclofenac 3%; apply 1-2 grams to the affected area(s) three to four times daily 12/19/14  Yes Ward Givens, NP  omeprazole (PRILOSEC) 20 MG capsule TAKE 1 CAPSULE BY MOUTH DAILY. 01/18/15  Yes Kathrynn Ducking, MD  pregabalin (LYRICA) 50 MG capsule Take 1 capsule (50 mg total) by mouth daily. 08/10/14  Yes Ward Givens, NP  QUEtiapine (SEROQUEL) 100 MG tablet TAKE 1 TABLET BY MOUTH AT BEDTIME 10/26/14  Yes Kathrynn Ducking, MD  rosuvastatin (CRESTOR) 10 MG tablet Take 10 mg by mouth daily.   Yes Historical Provider, MD  traZODone (DESYREL) 150 MG tablet Take 150 mg by mouth at bedtime.   Yes Historical Provider, MD    ROS:  Out of a complete 14 system review of symptoms, the patient complains only of the following symptoms, and all other reviewed systems are negative.  Appetite change Blurred vision Bruising easily Headache Depression, anxiety Back pain, neck pain, neck stiffness  Blood pressure 146/88, pulse 75, height 5\' 3"  (1.6 m), weight 166 lb (75.297 kg).  Physical Exam  General: The patient is alert and cooperative at the time  of the examination.  Skin: No significant peripheral edema is noted.   Neurologic Exam  Mental status: The patient is alert and oriented x 3 at the time of the examination. The patient has apparent normal recent and remote memory, with an apparently normal attention span and concentration ability.   Cranial nerves: Facial symmetry is present. Speech is normal, no aphasia or dysarthria is noted. Extraocular movements are full. Visual fields are full.  Motor: The patient has good strength in all 4 extremities.  Sensory examination: Soft touch sensation is symmetric on the face, arms, and  legs.  Coordination: The patient has good finger-nose-finger and heel-to-shin bilaterally.  Gait and station: The patient has a normal gait. Tandem gait is normal. Romberg is negative. No drift is seen.  Reflexes: Deep tendon reflexes are symmetric.   Assessment/Plan:  1. Cervical spondylosis  2. Cervicogenic headache  The patient is doing quite well at this time, we will continue his current medication regimen, a prescription was given for the Seroquel and Lyrica. The patient will follow-up in 6 months, sooner if needed.  Jill Alexanders MD 02/14/2015 7:04 PM  [facility] [address] [place], [place] [address]  Phone [phone] Fax [phone]

## 2015-03-20 ENCOUNTER — Other Ambulatory Visit: Payer: Self-pay | Admitting: Neurology

## 2015-03-20 ENCOUNTER — Telehealth: Payer: Self-pay | Admitting: *Deleted

## 2015-03-20 MED ORDER — HYDROCODONE-ACETAMINOPHEN 5-325 MG PO TABS: 1.0000 | ORAL_TABLET | Freq: Four times a day (QID) | ORAL | Status: DC | PRN

## 2015-03-20 NOTE — Telephone Encounter (Signed)
Request entered, forwarded to provider for approval.  

## 2015-03-20 NOTE — Telephone Encounter (Signed)
Patient called to request refill of HYDROcodone-acetaminophen (NORCO/VICODIN) 5-325 MG per tablet °

## 2015-03-20 NOTE — Telephone Encounter (Signed)
Called pt. Rx hydrocodone-acetaminophen ready to be picked up in office. Rx placed up front. Pt verbalized understanding.

## 2015-04-19 ENCOUNTER — Telehealth: Payer: Self-pay

## 2015-04-19 ENCOUNTER — Other Ambulatory Visit: Payer: Self-pay | Admitting: Neurology

## 2015-04-19 MED ORDER — HYDROCODONE-ACETAMINOPHEN 5-325 MG PO TABS: 1.0000 | ORAL_TABLET | Freq: Four times a day (QID) | ORAL | Status: DC | PRN

## 2015-04-19 NOTE — Telephone Encounter (Signed)
Patient is calling to get a written Rx for HYDROcodone-acetaminophen (NORCO/VICODIN) 5-325 MG tablet. I advised the Rx will be ready in 24 hours unless otherwise advised by the nurse. Thank you.

## 2015-04-19 NOTE — Telephone Encounter (Signed)
Rx ready for pick up. 

## 2015-04-19 NOTE — Telephone Encounter (Signed)
Request entered, forwarded to provider for review.  

## 2015-05-22 ENCOUNTER — Other Ambulatory Visit: Payer: Self-pay | Admitting: Neurology

## 2015-05-22 MED ORDER — HYDROCODONE-ACETAMINOPHEN 5-325 MG PO TABS: 1.0000 | ORAL_TABLET | Freq: Four times a day (QID) | ORAL | Status: DC | PRN

## 2015-05-22 NOTE — Telephone Encounter (Signed)
Patient is calling to get a written Rx hydrocodone acetaminophen 5-325 mg tablets.  Thanks!

## 2015-05-23 ENCOUNTER — Telehealth: Payer: Self-pay

## 2015-05-23 NOTE — Telephone Encounter (Signed)
Rn call patient to let her know the hydrocodone rx is ready for pick up. Pt verbalized understanding.

## 2015-06-07 DIAGNOSIS — Z139 Encounter for screening, unspecified: Secondary | ICD-10-CM

## 2015-06-16 ENCOUNTER — Telehealth: Payer: Self-pay | Admitting: *Deleted

## 2015-06-16 NOTE — Telephone Encounter (Signed)
Compound medication (lidocaine 5%, gabapentin 6%, diclofenac 3%) got denied by pt insurance. Received denial letter from [facility] dated 06/07/2015. [place] and spoke to [place]. They stated compounded cream not in formulary. She would need which one was the main ingredient, which is most expensive, and the NDC numbers on each medication in order to start PA process. Per Jinny Blossom, we are going to write a different rx and to notify pt.   Called pt pharmacy to receive pt insurance information: [place]: [id number], RxGRP: [phone] ,Suscriber ID: [id number]  LVM on cell to have her call office. Please relay message above that insurance denied cream and we are going to try sending a different prescription. Tried home number, continued to ring. Could not LVM.

## 2015-06-27 ENCOUNTER — Telehealth: Payer: Self-pay | Admitting: Neurology

## 2015-06-27 ENCOUNTER — Telehealth: Payer: Self-pay

## 2015-06-27 MED ORDER — HYDROCODONE-ACETAMINOPHEN 5-325 MG PO TABS: 1.0000 | ORAL_TABLET | Freq: Four times a day (QID) | ORAL | Status: DC | PRN

## 2015-06-27 NOTE — Addendum Note (Signed)
Addended by: Margette Fast on: 06/27/2015 01:03 PM   Modules accepted: Orders

## 2015-06-27 NOTE — Telephone Encounter (Signed)
I will write a Rx for the hydrocodone.

## 2015-06-27 NOTE — Telephone Encounter (Signed)
Pt needs refill on HYDROcodone-acetaminophen (NORCO/VICODIN) 5-325 MG tablet. Thank you

## 2015-06-27 NOTE — Telephone Encounter (Signed)
Rx ready for pick up. 

## 2015-07-06 ENCOUNTER — Encounter: Payer: Self-pay | Admitting: *Deleted

## 2015-07-06 ENCOUNTER — Other Ambulatory Visit: Payer: Self-pay | Admitting: *Deleted

## 2015-07-06 MED ORDER — OMEPRAZOLE 20 MG PO CPDR: 20.0000 mg | DELAYED_RELEASE_CAPSULE | Freq: Every day | ORAL | Status: DC

## 2015-07-06 MED ORDER — QUETIAPINE FUMARATE 100 MG PO TABS: 100.0000 mg | ORAL_TABLET | Freq: Every day | ORAL | Status: DC

## 2015-07-06 MED ORDER — PREGABALIN 50 MG PO CAPS: 50.0000 mg | ORAL_CAPSULE | Freq: Every day | ORAL | Status: DC

## 2015-07-18 ENCOUNTER — Telehealth: Payer: Self-pay

## 2015-07-18 NOTE — Telephone Encounter (Addendum)
Rn call [employer] at [phone] about letter receive about compound non formulary: below       Sig: Lidocaine Oint 5%, Gabapentin 6% and Diclofenac 3%; apply 1-2 grams to the affected area(s) three to four times daily. The [facility] representative stated that the patient receive a temporary supply on June 30, 2015 for the compound non formulary cream. The refill is only for 3 months. The medication will need a PA In May and [facility] will send the form  To [facility] for the staff to filled out.

## 2015-08-01 ENCOUNTER — Telehealth: Payer: Self-pay

## 2015-08-01 MED ORDER — NONFORMULARY OR COMPOUNDED ITEM: Status: DC

## 2015-08-01 NOTE — Addendum Note (Signed)
Addended by: Laurence Spates on: 08/01/2015 04:48 PM   Modules accepted: Orders

## 2015-08-01 NOTE — Telephone Encounter (Signed)
Medication formula corrected.

## 2015-08-01 NOTE — Telephone Encounter (Signed)
Refill request

## 2015-08-07 ENCOUNTER — Other Ambulatory Visit: Payer: Self-pay | Admitting: Neurology

## 2015-08-07 MED ORDER — HYDROCODONE-ACETAMINOPHEN 5-325 MG PO TABS: 1.0000 | ORAL_TABLET | Freq: Four times a day (QID) | ORAL | Status: DC | PRN

## 2015-08-07 NOTE — Telephone Encounter (Signed)
Patient is calling to order a written Rx hydrocodone-acetaminophen 5-325 mg.  Thanks!

## 2015-08-08 ENCOUNTER — Telehealth: Payer: Self-pay | Admitting: *Deleted

## 2015-08-08 NOTE — Telephone Encounter (Signed)
Placed up front

## 2015-08-08 NOTE — Telephone Encounter (Signed)
Rx ready for pick up.  (vicodan).

## 2015-08-08 NOTE — Telephone Encounter (Signed)
Spoke to pt and prescription ready for pick up.

## 2015-08-15 ENCOUNTER — Ambulatory Visit: Payer: Medicare Other | Admitting: Adult Health

## 2015-08-23 ENCOUNTER — Ambulatory Visit: Payer: Medicare Other | Admitting: Adult Health

## 2015-09-11 ENCOUNTER — Ambulatory Visit: Payer: Medicare Other | Admitting: Adult Health

## 2015-11-30 ENCOUNTER — Other Ambulatory Visit: Payer: Self-pay | Admitting: Neurology

## 2015-12-08 NOTE — Congregational Nurse Program (Signed)
Congregational Nurse Program Note  Date of Encounter: 06/07/2015  Past Medical History: Past Medical History:  Diagnosis Date  . Breast fibroadenoma    left  . Cellulitis   . Cervicogenic headache 10/07/2013  . Depressive disorder   . H/O: substance abuse 2001   xanax  . Herniated disc, cervical 1998  . 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 New   Patient is considered a/an Not Applicable   Race Caucasian/White     Patient Assistance   Location of Patient [place]   Patient's financial/insurance status Low Income   Uninsured Patient No   Patient referred to apply for the following financial assistance Not Applicable   Food insecurities addressed Not Applicable   Transportation assistance No   Assistance securing medications No   Educational health offerings Acute disease     Encounter Details   Primary purpose of visit Navigating the Healthcare System;Acute Illness/Condition Visit   Was an Emergency Department visit averted? Yes   Does patient have a medical provider? Yes   Patient referred to [place]   Was a mental health screening completed? (GAINS tool) No   Was a mental health referral made? No   Does patient have dental issues? Yes   Was a dental referral made? Yes   Does patient have vision issues? No   Was a vision referral made? No   Does your patient have an abnormal blood pressure today? No   Since previous encounter, have you referred patient for abnormal blood pressure that resulted in a new diagnosis or medication change? No   Does your patient have an abnormal blood glucose today? No   Since previous encounter, have you referred patient for abnormal blood glucose that resulted in a new diagnosis or medication change? No   Was there a life-saving intervention made? Yes      Referred  to [place] for abscessed tooth 06/08/2015.

## 2015-12-13 ENCOUNTER — Ambulatory Visit: Payer: Medicare Other | Admitting: Adult Health

## 2015-12-26 NOTE — Congregational Nurse Program (Unsigned)
Congregational Nurse Program Note  Date of Encounter: 06/07/2015  Past Medical History: Past Medical History:  Diagnosis Date  . Breast fibroadenoma    left  . Cellulitis   . Cervicogenic headache 10/07/2013  . Depressive disorder   . H/O: substance abuse 2001   xanax  . Herniated disc, cervical 1998  . 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 New   Patient is considered a/an Not Applicable   Race Caucasian/White     Patient Assistance   Location of Patient [place]   Patient's financial/insurance status Low Income   Uninsured Patient No   Patient referred to apply for the following financial assistance Not Applicable   Food insecurities addressed Not Applicable;Provided food supplies   Transportation assistance No   Assistance securing medications No   Educational health offerings Acute disease     Encounter Details   Primary purpose of visit Navigating the Healthcare System;Acute Illness/Condition Visit   Was an Emergency Department visit averted? Yes   Does patient have a medical provider? Yes   Patient referred to [place]   Was a mental health screening completed? (GAINS tool) No   Was a mental health referral made? No   Does patient have dental issues? Yes   Was a dental referral made? Yes   Does patient have vision issues? No   Was a vision referral made? No   Does your patient have an abnormal blood pressure today? No   Since previous encounter, have you referred patient for abnormal blood pressure that resulted in a new diagnosis or medication change? No   Does your patient have an abnormal blood glucose today? No   Since previous encounter, have you referred patient for abnormal blood glucose that resulted in a new diagnosis or medication change? No   Was there a life-saving intervention  made? Yes      Client referred to [facility] for abscessed tooth.  Tarri Fuller CN  [phone]

## 2016-01-09 ENCOUNTER — Ambulatory Visit (INDEPENDENT_AMBULATORY_CARE_PROVIDER_SITE_OTHER): Payer: Medicare HMO | Admitting: Adult Health

## 2016-01-09 ENCOUNTER — Encounter: Payer: Self-pay | Admitting: Adult Health

## 2016-01-09 VITALS — BP 118/68 | HR 90 | Resp 20 | Ht 63.0 in | Wt 169.5 lb

## 2016-01-09 DIAGNOSIS — G4486 Cervicogenic headache: Secondary | ICD-10-CM

## 2016-01-09 DIAGNOSIS — R51 Headache: Secondary | ICD-10-CM | POA: Diagnosis not present

## 2016-01-09 MED ORDER — PREGABALIN 50 MG PO CAPS: 50.0000 mg | ORAL_CAPSULE | Freq: Two times a day (BID) | ORAL | 5 refills | Status: AC

## 2016-01-09 MED ORDER — OMEPRAZOLE 20 MG PO CPDR: 20.0000 mg | DELAYED_RELEASE_CAPSULE | Freq: Every day | ORAL | 3 refills | Status: DC

## 2016-01-09 NOTE — Progress Notes (Signed)
I reviewed above note and agree with the assessment and plan.  Jonnell Hentges, MD PhD Stroke Neurology 01/09/2016 5:47 PM    

## 2016-01-09 NOTE — Progress Notes (Signed)
PATIENT: NEHIR QIU DOB: 1955/11/15  REASON FOR VISIT: follow up- cervical spondylosis, cervicogenic headache HISTORY FROM: patient  HISTORY OF PRESENT ILLNESS: Ms Wafer is a 60 year old female with a history of cervical spondylosis and cervicogenic headaches. The patient reports that she does well on Lyrica 50 daily. She states that she did discontinue the Seroquel as she felt that it made her restless leg symptoms worse. She states that she still has a lot of discomfort with activities of daily living. She states that she has a hard time completing household tasks such as mopping and cleaning. She states the pain is typically located in the back of the neck sometimes radiating down the shoulders. In the past she has been on hydrocodone however she states that she had a hard time coming every month to pick up the prescription so she is not taking this. She denies any new neurological symptoms. She returns today for an evaluation.  HISTORY 02/14/15: Ms. Mcgorty is a 60 year old right-handed white female with a history of cervical spondylosis and cervicogenic headache. The patient has done relatively well with the regimen of Lyrica and Seroquel that has allowed her to sleep, and this has helped her discomfort significant. The patient is not having any significant headaches during the month, and she is functioning fairly well. She notes that certain types of housework such as sweeping or mopping will worsen the pain. She denies any new symptoms since last seen. She is tolerating her medication regimen well. She returns for an evaluation.  REVIEW OF SYSTEMS: Out of a complete 14 system review of symptoms, the patient complains only of the following symptoms, and all other reviewed systems are negative.  Cough, wheezing, activity change, restless leg, apnea, frequent waking, aching  muscles, neck pain, neck stiffness, depression, nervous/anxious, headache, bruise easily  ALLERGIES: Allergies  Allergen Reactions  . Gabapentin     Leg swelling  . Lyrica [Pregabalin]     Leg swelling  . Sulfa Antibiotics Hives    HOME MEDICATIONS: Outpatient Medications Prior to Visit  Medication Sig Dispense Refill  . HYDROcodone-acetaminophen (NORCO/VICODIN) 5-325 MG tablet Take 1 tablet by mouth every 6 (six) hours as needed for moderate pain. Must last 28 days 30 tablet 0  . NONFORMULARY OR COMPOUNDED ITEM C-Doxepin-Cycloben-Gabapent-Ibuprofen-Lido 1.9-2-2-2-2.25%; apply 1-2 grams to the affected area(s) three to four times daily 240 each 3  . omeprazole (PRILOSEC) 20 MG capsule Take 1 capsule (20 mg total) by mouth daily. 90 capsule 1  . pregabalin (LYRICA) 50 MG capsule Take 1 capsule (50 mg total) by mouth daily. 90 capsule 1  . rosuvastatin (CRESTOR) 10 MG tablet Take 10 mg by mouth daily.    . diazepam (VALIUM) 10 MG tablet Take 10 mg by mouth every 8 (eight) hours as needed for anxiety.    . DULoxetine (CYMBALTA) 60 MG capsule Take 60 mg by mouth daily.    . QUEtiapine (SEROQUEL) 100 MG tablet Take 1 tablet (100 mg total) by mouth at bedtime. (Patient not taking: Reported on 01/09/2016) 90 tablet 1  . traZODone (DESYREL) 150 MG tablet Take 150 mg by mouth at bedtime.     No facility-administered medications prior to visit.     PAST MEDICAL HISTORY: Past Medical History:  Diagnosis Date  . Breast fibroadenoma    left  . Cellulitis   . Cervicogenic headache 10/07/2013  . Depressive disorder   . H/O: substance abuse 2001   xanax  . Herniated disc, cervical 1998  .  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 BREAST FIBROADENOMA SURGERY    . NECK SURGERY    . TUBAL LIGATION Bilateral     FAMILY HISTORY: Family History  Problem Relation Age of Onset  . Cancer  Mother   . Cirrhosis Father     SOCIAL HISTORY: Social History   Social History  . Marital status: Divorced    Spouse name: N/A  . Number of children: 2  . Years of education: 11th   Occupational History  . disabiltiy    Social History Main Topics  . Smoking status: Current Every Day Smoker    Packs/day: 1.00    Years: 30.00    Types: Cigarettes  . Smokeless tobacco: Never Used     Comment: Oct 01, 2013  . Alcohol use No     Comment: former alcohol abuse quit 2001  . Drug use: No  . Sexual activity: Not on file   Other Topics Concern  . Not on file   Social History Narrative   Patient lives at home with son.    Patient has 2 children.    Patient on disability.    Patient is right handed.    Patient is divorced.    Education 11 th .   Patient drinks about 6 cups of caffeine daily.         PHYSICAL EXAM  Vitals:   01/09/16 1525  BP: 118/68  Pulse: 90  Resp: 20  Weight: 169 lb 8 oz (76.9 kg)  Height: 5\' 3"  (1.6 m)   Body mass index is 30.03 kg/m.  Generalized: Well developed, in no acute distress   Neurological examination  Mentation: Alert oriented to time, place, history taking. Follows all commands speech and language fluent Cranial nerve II-XII: Pupils were equal round reactive to light. Extraocular movements were full, visual field were full on confrontational test. Facial sensation and strength were normal. Uvula tongue midline. Head turning and shoulder shrug  were normal and symmetric. Motor: The motor testing reveals 5 over 5 strength of all 4 extremities. Good symmetric motor tone is noted throughout.  Sensory: Sensory testing is intact to soft touch on all 4 extremities. No evidence of extinction is noted.  Coordination: Cerebellar testing reveals good finger-nose-finger and heel-to-shin bilaterally.  Gait and station: Gait is normal.  Reflexes: Deep tendon reflexes are symmetric and normal bilaterally.   DIAGNOSTIC DATA (LABS, IMAGING,  TESTING) - I reviewed patient records, labs, notes, testing and imaging myself where available.     ASSESSMENT AND PLAN 60 y.o. year old female  has a past medical history of Breast fibroadenoma; Cellulitis; Cervicogenic headache (10/07/2013); Depressive disorder; H/O: substance abuse (2001); Herniated disc, cervical (1998); and Legally blind in right eye, as defined in [place]. here with:  1. Cervicogenic headache  The patient will continue on Lyrica. We will increase to 50 mg twice a day to see if it offers her better benefit. The patient asked if we would refill her hydrocodone, I explained that she's not had this refilled since April it would be best if we try to control her discomfort with Lyrica. She voiced understanding. She will follow-up in 6 months or sooner if needed.     Ward Givens, MSN, NP-C 01/09/2016, 3:38 PM [facility] [address], [place], [place] [address] [phone]

## 2016-01-09 NOTE — Patient Instructions (Addendum)
Increase lyrica 50 mg twice a day If your symptoms worsen or you develop new symptoms please let us know.

## 2016-01-10 ENCOUNTER — Other Ambulatory Visit: Payer: Self-pay | Admitting: Neurology

## 2016-01-18 ENCOUNTER — Other Ambulatory Visit: Payer: Self-pay | Admitting: Neurology

## 2016-01-25 ENCOUNTER — Telehealth: Payer: Self-pay | Admitting: Adult Health

## 2016-01-25 NOTE — Telephone Encounter (Signed)
Pt's rx was sent to wrong pharmacy. Please send pregabalin (LYRICA) 50 MG capsule to [facility], [place].

## 2016-01-26 NOTE — Telephone Encounter (Signed)
Rx. faxed to [facility] yesterday/fim

## 2016-01-30 ENCOUNTER — Other Ambulatory Visit: Payer: Self-pay | Admitting: Neurology

## 2016-02-14 ENCOUNTER — Other Ambulatory Visit: Payer: Self-pay | Admitting: Adult Health

## 2016-02-14 NOTE — Telephone Encounter (Signed)
Looks from last ofv note, pt had decided not to take seroquel.  She has changed her mind.

## 2016-02-14 NOTE — Telephone Encounter (Signed)
Pt called said she has changed her mind and she wants refill for quetiapine 100mg 

## 2016-02-14 NOTE — Telephone Encounter (Signed)
I spoke to pt.  She stated that her RLS are better, but  she wants to restart the seroquel due it helped her sleep.  She wanted to try it for that reason and see if the RL would act up again.  [facility] pharmacy.

## 2016-02-14 NOTE — Telephone Encounter (Signed)
Can you call and get more information and why she wants to restart. She stopped because she felt that it made her RLS worse

## 2016-02-15 MED ORDER — QUETIAPINE FUMARATE 100 MG PO TABS: 100.0000 mg | ORAL_TABLET | Freq: Every day | ORAL | 5 refills | Status: DC

## 2016-02-16 NOTE — Telephone Encounter (Signed)
Spoke to pt and let her know that prescription escribed to [facility].  She verbalized understanding.

## 2016-03-25 ENCOUNTER — Telehealth: Payer: Self-pay | Admitting: *Deleted

## 2016-03-25 NOTE — Telephone Encounter (Signed)
Fax confirmation received compounded cream.  [phone].

## 2016-03-25 NOTE — Telephone Encounter (Signed)
Received fax about compounded cream on this pt.  I called [facility] and Valetta Fuller, stated that pts insurance [employer] ok'd this compound.  C-Diclofenac/ Lidocaine/ Prilocaine 0.5%/ 1.25%/ 1.25% CR    Apply 1-2 Gm 3-4 times a daily to pain aread and rub in well.  240GM .  This different then what is listed.   Please advise.

## 2016-03-25 NOTE — Telephone Encounter (Signed)
Completed.

## 2016-06-20 ENCOUNTER — Other Ambulatory Visit: Payer: Self-pay | Admitting: Adult Health

## 2016-06-21 ENCOUNTER — Telehealth: Payer: Self-pay | Admitting: Neurology

## 2016-06-21 MED ORDER — OMEPRAZOLE 20 MG PO CPDR: 20.0000 mg | DELAYED_RELEASE_CAPSULE | Freq: Every day | ORAL | 3 refills | Status: AC

## 2016-06-21 MED ORDER — OMEPRAZOLE 20 MG PO CPDR: 20.0000 mg | DELAYED_RELEASE_CAPSULE | Freq: Every day | ORAL | 3 refills | Status: DC

## 2016-06-21 MED ORDER — QUETIAPINE FUMARATE 100 MG PO TABS: 100.0000 mg | ORAL_TABLET | Freq: Every day | ORAL | 1 refills | Status: DC

## 2016-06-21 MED ORDER — QUETIAPINE FUMARATE 100 MG PO TABS: 100.0000 mg | ORAL_TABLET | Freq: Every day | ORAL | 1 refills | Status: AC

## 2016-06-21 NOTE — Telephone Encounter (Signed)
I called patient. The last note from our office indicated that she had discontinued the Seroquel, the patient claims that she started the medication back again, she has no revisit schedule, I will try to get a revisit set up for her.

## 2016-06-24 NOTE — Telephone Encounter (Addendum)
Called and spoke to pt. She thought she had appt in March but she stated she was told the last time she could not come in until she paid 90 dollars d/t bad debt balance. She stated she is willing to make payments if needed. Advised I will check with billing department and call her back to advise. She verbalized understanding.

## 2016-06-24 NOTE — Telephone Encounter (Signed)
If the patient refuses to make payments of past that, she cannot follow-up through this office, if she cannot follow-up through this office, she can no longer get prescriptions through this office.

## 2016-06-24 NOTE — Telephone Encounter (Signed)
Called and spoke to pt. Relayed CW,MD message below. She verbalized understanding.

## 2016-06-24 NOTE — Telephone Encounter (Signed)
Dr Jannifer Franklin- I had Latoya Poole in billing call and speak to patient about balance. She declined making payment at this time. Unable to make appt per Latoya Poole. What would you like to do?

## 2017-03-24 ENCOUNTER — Encounter: Payer: Self-pay | Admitting: Gastroenterology

## 2017-05-14 ENCOUNTER — Ambulatory Visit: Payer: Medicare Other | Admitting: Gastroenterology

## 2017-06-02 ENCOUNTER — Other Ambulatory Visit: Payer: Self-pay | Admitting: Neurology

## 2017-07-02 ENCOUNTER — Encounter: Payer: Self-pay | Admitting: Gastroenterology

## 2017-07-02 ENCOUNTER — Encounter: Payer: Self-pay | Admitting: *Deleted

## 2017-07-02 ENCOUNTER — Other Ambulatory Visit: Payer: Self-pay | Admitting: *Deleted

## 2017-07-02 ENCOUNTER — Ambulatory Visit: Payer: Medicare HMO | Admitting: Gastroenterology

## 2017-07-02 DIAGNOSIS — R1013 Epigastric pain: Secondary | ICD-10-CM

## 2017-07-02 DIAGNOSIS — R195 Other fecal abnormalities: Secondary | ICD-10-CM | POA: Diagnosis not present

## 2017-07-02 NOTE — Patient Instructions (Signed)
DRINK WATER TO KEEP YOUR URINE LIGHT YELLOW.  FOLLOW A HIGH FIBER DIET. AVOID ITEMS THAT CAUSE BLOATING & GAS.SEE INFO BELOW.  COMPLETE UPPER ENDOSCOPY IN 2 3 WEEKS.   I WILL OBTAIN YOUR COLONOSCOPY AND PATHOLOGY REPORT FROM [facility].  FOLLOW UP IN THE OFFICE WILL BE SCHEDULED IF NEEDED AFTER ENDOSCOPY.  High-Fiber Diet A high-fiber diet changes your normal diet to include more whole grains, legumes, fruits, and vegetables. Changes in the diet involve replacing refined carbohydrates with unrefined foods. The calorie level of the diet is essentially unchanged. The Dietary Reference Intake (recommended amount) for adult males is 38 grams per day. For adult females, it is 25 grams per day. Pregnant and lactating women should consume 28 grams of fiber per day. Fiber is the intact part of a plant that is not broken down during digestion. Functional fiber is fiber that has been isolated from the plant to provide a beneficial effect in the body. PURPOSE  Increase stool bulk.   Ease and regulate bowel movements.   Lower cholesterol.   REDUCE RISK OF COLON CANCER  INDICATIONS THAT YOU NEED MORE FIBER  Constipation and hemorrhoids.   Uncomplicated diverticulosis (intestine condition) and irritable bowel syndrome.   Weight management.   As a protective measure against hardening of the arteries (atherosclerosis), diabetes, and cancer.   GUIDELINES FOR INCREASING FIBER IN THE DIET  Start adding fiber to the diet slowly. A gradual increase of about 5 more grams (2 slices of whole-wheat bread, 2 servings of most fruits or vegetables, or 1 bowl of high-fiber cereal) per day is best. Too rapid an increase in fiber may result in constipation, flatulence, and bloating.   Drink enough water and fluids to keep your urine clear or pale yellow. Water, juice, or caffeine-free drinks are recommended. Not drinking enough fluid may cause constipation.   Eat a variety of high-fiber foods rather than one  type of fiber.   Try to increase your intake of fiber through using high-fiber foods rather than fiber pills or supplements that contain small amounts of fiber.   The goal is to change the types of food eaten. Do not supplement your present diet with high-fiber foods, but replace foods in your present diet.   INCLUDE A VARIETY OF FIBER SOURCES  Replace refined and processed grains with whole grains, canned fruits with fresh fruits, and incorporate other fiber sources. White rice, white breads, and most bakery goods contain little or no fiber.   Brown whole-grain rice, buckwheat oats, and many fruits and vegetables are all good sources of fiber. These include: broccoli, Brussels sprouts, cabbage, cauliflower, beets, sweet potatoes, white potatoes (skin on), carrots, tomatoes, eggplant, squash, berries, fresh fruits, and dried fruits.   Cereals appear to be the richest source of fiber. Cereal fiber is found in whole grains and bran. Bran is the fiber-rich outer coat of cereal grain, which is largely removed in refining. In whole-grain cereals, the bran remains. In breakfast cereals, the largest amount of fiber is found in those with "bran" in their names. The fiber content is sometimes indicated on the label.   You may need to include additional fruits and vegetables each day.   In baking, for 1 cup white flour, you may use the following substitutions:   1 cup whole-wheat flour minus 2 tablespoons.   1/2 cup white flour plus 1/2 cup whole-wheat flour.

## 2017-07-02 NOTE — Assessment & Plan Note (Signed)
MOST LIKELY DUE TO HEMORRHOIDS AND GASTRITIS. ASSOCIATED WITH DYSPEPSIA AND TCS IN ?2015 WITH COLON POLYPS(3) REMOVED. DIFFERENTIAL DIAGNOSIS INCLUDES GASTRITIS, HEMORRHOIDS, COLON POLYPS, AVMs, & LESS LIKELY COLON CA.  DRINK WATER TO KEEP YOUR URINE LIGHT YELLOW. FOLLOW A HIGH FIBER DIET. AVOID ITEMS THAT CAUSE BLOATING & GAS. HANDOUT GIVEN. COMPLETE UPPER ENDOSCOPY IN 2 3 WEEKS. DISCUSSED PROCEDURE, BENEFITS, & RISKS: < 1% chance of medication reaction, bleeding, perforation, or rupture of spleen/liver. WILL OBTAIN YOUR COLONOSCOPY AND PATHOLOGY REPORT FROM [facility]. FOLLOW UP IN THE OFFICE WILL BE SCHEDULED IF NEEDED AFTER ENDOSCOPY.

## 2017-07-02 NOTE — Progress Notes (Addendum)
Subjective:    Patient ID: Latoya Poole, female    DOB: Jul 02, 1955, 62 y.o.   MRN: [id number]  Monico Blitz, MD   HPI  Blood in stool one time when she was constipated 3-4 weeks ago. BMs: USUALLY QOD. THIS IS HER USUAL PATTERN OCCASIONAL ABDOMINAL PAIN IN UPPER ABDOMEN. NAUSEA: 1-2X/MO. NO TRIGGERS. USED TO USE ASA/BCs BUT STOPPED ABOUT 2 MOS AGO. NO ETOH. IBUPROFEN OCCASIONALLY BUT USUALLY TYLENOL. SOB USUALLY BUT NOT WORSE. QUIT SMOKING 1 MO AGO. FEELS LIKE SHE HAS TO BURP BUT CAN'T AND THEN NAUSEA(1-2X/MO). IN JAN HAD A SPELL LASTED 2 DAYS. LAST TCS 2015 HAD THREE POLYPS REMOVED AND ?EMR.  PT DENIES FEVER, CHILLS, HEMATEMESIS,  vomiting, melena, diarrhea, CHEST PAIN, CHANGE IN BOWEL IN HABITS, problems swallowing, problems with sedation, OR heartburn.  Past Medical History:  Diagnosis Date  . Breast fibroadenoma    left  . Cellulitis   . Cervicogenic headache 10/07/2013  . Depressive disorder   . H/O: substance abuse 2001   xanax  . Herniated disc, cervical 1998  . 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 BREAST FIBROADENOMA SURGERY    . NECK SURGERY    . TUBAL LIGATION Bilateral    Allergies  Allergen Reactions  . Gabapentin     Leg swelling  . Lyrica [Pregabalin]     Leg swelling  . Sulfa Antibiotics Hives   Current Outpatient Medications  Medication Sig Dispense Refill  . albuterol (ACCUNEB) 0.63 MG/3ML nebulizer solution Take 1 ampule by nebulization every 6 (six) hours as needed for wheezing.    . clonazePAM (KLONOPIN) 0.5 MG tablet Take 0.5 mg by mouth daily as needed.    Marland Kitchen omeprazole (PRILOSEC) 20 MG capsule Take 1 capsule (20 mg total) by mouth daily.    . pregabalin (LYRICA) 50 MG capsule Take 1 capsule (50 mg total) by mouth 2 (two) times daily.    . QUEtiapine (SEROQUEL) 100 MG tablet Take 1 tablet (100 mg total) by mouth at bedtime. (Patient taking differently: Take 100 mg by  mouth as needed. )    . rosuvastatin (CRESTOR) 10 MG tablet Take 10 mg by mouth daily.    Marland Kitchen albuterol (PROVENTIL,VENTOLIN) 2 MG/5ML syrup Take 2 mg by mouth 3 (three) times daily.    . diazepam (VALIUM) 10 MG tablet Take 10 mg by mouth every 8 (eight) hours as needed for anxiety.    . DULoxetine (CYMBALTA) 60 MG capsule Take 60 mg by mouth daily.    . NONFORMULARY OR COMPOUNDED ITEM C-Doxepin-Cycloben-Gabapent-Ibuprofen-Lido 1.9-2-2-2-2.25%; apply 1-2 grams to the affected area(s) three to four times daily (Patient not taking: Reported on 07/02/2017)    . traZODone (DESYREL) 150 MG tablet Take 150 mg by mouth at bedtime.      Family History  Problem Relation Age of Onset  . Cancer Mother   . Cirrhosis Father   . Colon cancer Neg Hx   . Colon polyps Neg Hx     Social History   Tobacco Use  . Smoking status: Former Smoker    Packs/day: 1.00    Years: 30.00    Pack years: 30.00    Types: Cigarettes  . Smokeless tobacco: Never Used  Substance Use Topics  . Alcohol use: No    Alcohol/week: 0.0 oz    Comment: former alcohol abuse quit 2001  . Drug use: No   Review  of Systems PER HPI OTHERWISE ALL SYSTEMS ARE NEGATIVE.    Objective:   Physical Exam  Constitutional: She is oriented to person, place, and time. She appears well-developed and well-nourished. No distress.  HENT:  Head: Normocephalic and atraumatic.  Mouth/Throat: Oropharynx is clear and moist. No oropharyngeal exudate.  Eyes: Pupils are equal, round, and reactive to light. No scleral icterus.  Neck: Normal range of motion. Neck supple.  Cardiovascular: Normal rate, regular rhythm and normal heart sounds.  Pulmonary/Chest: Effort normal and breath sounds normal. No respiratory distress.  Abdominal: Soft. Bowel sounds are normal. She exhibits no distension. There is no tenderness.  Musculoskeletal: She exhibits no edema.  Lymphadenopathy:    She has no cervical adenopathy.  Neurological: She is alert and oriented to  person, place, and time.  NO FOCAL DEFICITS  Psychiatric:  FLAT AFFECT, NL MOOD  Vitals reviewed.     Assessment & Plan:

## 2017-07-02 NOTE — Progress Notes (Signed)
cc'ed to pcp °

## 2017-07-03 ENCOUNTER — Telehealth: Payer: Self-pay | Admitting: *Deleted

## 2017-07-03 ENCOUNTER — Encounter: Payer: Self-pay | Admitting: *Deleted

## 2017-07-03 NOTE — Telephone Encounter (Signed)
Pt is aware pre-op scheduled for 07/31/17 at 11:00am. Letter mailed

## 2017-07-08 ENCOUNTER — Telehealth: Payer: Self-pay | Admitting: Gastroenterology

## 2017-07-08 NOTE — Telephone Encounter (Signed)
Pt is scheduled with [facility] on 4/2. Patient has questions about her insurance and if she needs to pay a $200 co pay upfront before they would do her procedure. I told her that the billing office should work with her on any bills and make payment arrangements if needed.  She would like for the nurse to call her at [phone]

## 2017-07-08 NOTE — Telephone Encounter (Signed)
Called spoke with patient and made her aware she would need to talk to billing. She was provided with the phone #.

## 2017-07-29 NOTE — Patient Instructions (Signed)
Latoya Poole  07/29/2017     @PREFPERIOPPHARMACY @   Your procedure is scheduled on  08/05/2017   Report to [facility] at  14   A.M.  Call this number if you have problems the morning of surgery:  [phone]   Remember:  Do not eat food or drink liquids after midnight.  Take these medicines the morning of surgery with A SIP OF WATER  Klonopin, prilosec, lyrica. Use your inhalers and nebulizer before you come.   Do not wear jewelry, make-up or nail polish.  Do not wear lotions, powders, or perfumes, or deodorant.  Do not shave 48 hours prior to surgery.  Men may shave face and neck.  Do not bring valuables to the hospital.  [facility] is not responsible for any belongings or valuables.  Contacts, dentures or bridgework may not be worn into surgery.  Leave your suitcase in the car.  After surgery it may be brought to your room.  For patients admitted to the hospital, discharge time will be determined by your treatment team.  Patients discharged the day of surgery will not be allowed to drive home.   Name and phone number of your driver:   Family Special instructions:  Follow the diet and prep instructions given to you by Dr Nona Dell office.  Please read over the following fact sheets that you were given. Anesthesia Post-op Instructions and Care and Recovery After Surgery       Esophagogastroduodenoscopy Esophagogastroduodenoscopy (EGD) is a procedure to examine the lining of the esophagus, stomach, and first part of the small intestine (duodenum). This procedure is done to check for problems such as inflammation, bleeding, ulcers, or growths. During this procedure, a long, flexible, lighted tube with a camera attached (endoscope) is inserted down the throat. Tell a health care provider about:  Any allergies you have.  All medicines you are taking, including vitamins, herbs, eye drops, creams, and over-the-counter medicines.  Any problems you or family  members have had with anesthetic medicines.  Any blood disorders you have.  Any surgeries you have had.  Any medical conditions you have.  Whether you are pregnant or may be pregnant. What are the risks? Generally, this is a safe procedure. However, problems may occur, including:  Infection.  Bleeding.  A tear (perforation) in the esophagus, stomach, or duodenum.  Trouble breathing.  Excessive sweating.  Spasms of the larynx.  A slowed heartbeat.  Low blood pressure.  What happens before the procedure?  Follow instructions from your health care provider about eating or drinking restrictions.  Ask your health care provider about: ? Changing or stopping your regular medicines. This is especially important if you are taking diabetes medicines or blood thinners. ? Taking medicines such as aspirin and ibuprofen. These medicines can thin your blood. Do not take these medicines before your procedure if your health care provider instructs you not to.  Plan to have someone take you home after the procedure.  If you wear dentures, be ready to remove them before the procedure. What happens during the procedure?  To reduce your risk of infection, your health care team will wash or sanitize their hands.  An IV tube will be put in a vein in your hand or arm. You will get medicines and fluids through this tube.  You will be given one or more of the following: ? A medicine to help you relax (sedative). ? A medicine to numb  the area (local anesthetic). This medicine may be sprayed into your throat. It will make you feel more comfortable and keep you from gagging or coughing during the procedure. ? A medicine for pain.  A mouth guard may be placed in your mouth to protect your teeth and to keep you from biting on the endoscope.  You will be asked to lie on your left side.  The endoscope will be lowered down your throat into your esophagus, stomach, and duodenum.  Air will be put  into the endoscope. This will help your health care provider see better.  The lining of your esophagus, stomach, and duodenum will be examined.  Your health care provider may: ? Take a tissue sample so it can be looked at in a lab (biopsy). ? Remove growths. ? Remove objects (foreign bodies) that are stuck. ? Treat any bleeding with medicines or other devices that stop tissue from bleeding. ? Widen (dilate) or stretch narrowed areas of your esophagus and stomach.  The endoscope will be taken out. The procedure may vary among health care providers and hospitals. What happens after the procedure?  Your blood pressure, heart rate, breathing rate, and blood oxygen level will be monitored often until the medicines you were given have worn off.  Do not eat or drink anything until the numbing medicine has worn off and your gag reflex has returned. This information is not intended to replace advice given to you by your health care provider. Make sure you discuss any questions you have with your health care provider. Document Released: 08/23/2004 Document Revised: 09/28/2015 Document Reviewed: 03/16/2015 Elsevier Interactive Patient Education  2018 [employer]. Esophagogastroduodenoscopy, Care After Refer to this sheet in the next few weeks. These instructions provide you with information about caring for yourself after your procedure. Your health care provider may also give you more specific instructions. Your treatment has been planned according to current medical practices, but problems sometimes occur. Call your health care provider if you have any problems or questions after your procedure. What can I expect after the procedure? After the procedure, it is common to have:  A sore throat.  Nausea.  Bloating.  Dizziness.  Fatigue.  Follow these instructions at home:  Do not eat or drink anything until the numbing medicine (local anesthetic) has worn off and your gag reflex has  returned. You will know that the local anesthetic has worn off when you can swallow comfortably.  Do not drive for 24 hours if you received a medicine to help you relax (sedative).  If your health care provider took a tissue sample for testing during the procedure, make sure to get your test results. This is your responsibility. Ask your health care provider or the department performing the test when your results will be ready.  Keep all follow-up visits as told by your health care provider. This is important. Contact a health care provider if:  You cannot stop coughing.  You are not urinating.  You are urinating less than usual. Get help right away if:  You have trouble swallowing.  You cannot eat or drink.  You have throat or chest pain that gets worse.  You are dizzy or light-headed.  You faint.  You have nausea or vomiting.  You have chills.  You have a fever.  You have severe abdominal pain.  You have black, tarry, or bloody stools. This information is not intended to replace advice given to you by your health care provider. Make sure  you discuss any questions you have with your health care provider. Document Released: 04/08/2012 Document Revised: 09/28/2015 Document Reviewed: 03/16/2015 Elsevier Interactive Patient Education  2018 [employer] Anesthesia is a term that refers to techniques, procedures, and medicines that help a person stay safe and comfortable during a medical procedure. Monitored anesthesia care, or sedation, is one type of anesthesia. Your anesthesia specialist may recommend sedation if you will be having a procedure that does not require you to be unconscious, such as:  Cataract surgery.  A dental procedure.  A biopsy.  A colonoscopy.  During the procedure, you may receive a medicine to help you relax (sedative). There are three levels of sedation:  Mild sedation. At this level, you may feel awake and relaxed.  You will be able to follow directions.  Moderate sedation. At this level, you will be sleepy. You may not remember the procedure.  Deep sedation. At this level, you will be asleep. You will not remember the procedure.  The more medicine you are given, the deeper your level of sedation will be. Depending on how you respond to the procedure, the anesthesia specialist may change your level of sedation or the type of anesthesia to fit your needs. An anesthesia specialist will monitor you closely during the procedure. Let your health care provider know about:  Any allergies you have.  All medicines you are taking, including vitamins, herbs, eye drops, creams, and over-the-counter medicines.  Any use of steroids (by mouth or as a cream).  Any problems you or family members have had with sedatives and anesthetic medicines.  Any blood disorders you have.  Any surgeries you have had.  Any medical conditions you have, such as sleep apnea.  Whether you are pregnant or may be pregnant.  Any use of cigarettes, alcohol, or street drugs. What are the risks? Generally, this is a safe procedure. However, problems may occur, including:  Getting too much medicine (oversedation).  Nausea.  Allergic reaction to medicines.  Trouble breathing. If this happens, a breathing tube may be used to help with breathing. It will be removed when you are awake and breathing on your own.  Heart trouble.  Lung trouble.  Before the procedure Staying hydrated Follow instructions from your health care provider about hydration, which may include:  Up to 2 hours before the procedure - you may continue to drink clear liquids, such as water, clear fruit juice, black coffee, and plain tea.  Eating and drinking restrictions Follow instructions from your health care provider about eating and drinking, which may include:  8 hours before the procedure - stop eating heavy meals or foods such as meat, fried foods,  or fatty foods.  6 hours before the procedure - stop eating light meals or foods, such as toast or cereal.  6 hours before the procedure - stop drinking milk or drinks that contain milk.  2 hours before the procedure - stop drinking clear liquids.  Medicines Ask your health care provider about:  Changing or stopping your regular medicines. This is especially important if you are taking diabetes medicines or blood thinners.  Taking medicines such as aspirin and ibuprofen. These medicines can thin your blood. Do not take these medicines before your procedure if your health care provider instructs you not to.  Tests and exams  You will have a physical exam.  You may have blood tests done to show: ? How well your kidneys and liver are working. ? How well  your blood can clot.  General instructions  Plan to have someone take you home from the hospital or clinic.  If you will be going home right after the procedure, plan to have someone with you for 24 hours.  What happens during the procedure?  Your blood pressure, heart rate, breathing, level of pain and overall condition will be monitored.  An IV tube will be inserted into one of your veins.  Your anesthesia specialist will give you medicines as needed to keep you comfortable during the procedure. This may mean changing the level of sedation.  The procedure will be performed. After the procedure  Your blood pressure, heart rate, breathing rate, and blood oxygen level will be monitored until the medicines you were given have worn off.  Do not drive for 24 hours if you received a sedative.  You may: ? Feel sleepy, clumsy, or nauseous. ? Feel forgetful about what happened after the procedure. ? Have a sore throat if you had a breathing tube during the procedure. ? Vomit. This information is not intended to replace advice given to you by your health care provider. Make sure you discuss any questions you have with your health  care provider. Document Released: 01/16/2005 Document Revised: 09/29/2015 Document Reviewed: 08/13/2015 Elsevier Interactive Patient Education  2018 [place], Care After These instructions provide you with information about caring for yourself after your procedure. Your health care provider may also give you more specific instructions. Your treatment has been planned according to current medical practices, but problems sometimes occur. Call your health care provider if you have any problems or questions after your procedure. What can I expect after the procedure? After your procedure, it is common to:  Feel sleepy for several hours.  Feel clumsy and have poor balance for several hours.  Feel forgetful about what happened after the procedure.  Have poor judgment for several hours.  Feel nauseous or vomit.  Have a sore throat if you had a breathing tube during the procedure.  Follow these instructions at home: For at least 24 hours after the procedure:   Do not: ? Participate in activities in which you could fall or become injured. ? Drive. ? Use heavy machinery. ? Drink alcohol. ? Take sleeping pills or medicines that cause drowsiness. ? Make important decisions or sign legal documents. ? Take care of children on your own.  Rest. Eating and drinking  Follow the diet that is recommended by your health care provider.  If you vomit, drink water, juice, or soup when you can drink without vomiting.  Make sure you have little or no nausea before eating solid foods. General instructions  Have a responsible adult stay with you until you are awake and alert.  Take over-the-counter and prescription medicines only as told by your health care provider.  If you smoke, do not smoke without supervision.  Keep all follow-up visits as told by your health care provider. This is important. Contact a health care provider if:  You keep feeling nauseous or  you keep vomiting.  You feel light-headed.  You develop a rash.  You have a fever. Get help right away if:  You have trouble breathing. This information is not intended to replace advice given to you by your health care provider. Make sure you discuss any questions you have with your health care provider. Document Released: 08/13/2015 Document Revised: 12/13/2015 Document Reviewed: 08/13/2015 Elsevier Interactive Patient Education  [employer].

## 2017-07-31 ENCOUNTER — Encounter (HOSPITAL_COMMUNITY): Payer: Self-pay

## 2017-07-31 ENCOUNTER — Other Ambulatory Visit: Payer: Self-pay

## 2017-07-31 ENCOUNTER — Encounter (HOSPITAL_COMMUNITY)
Admission: RE | Admit: 2017-07-31 | Discharge: 2017-07-31 | Disposition: A | Discharge status: Home / Self Care | Payer: Medicare HMO | Source: Ambulatory Visit | Attending: Gastroenterology | Admitting: Gastroenterology

## 2017-07-31 DIAGNOSIS — Z01812 Encounter for preprocedural laboratory examination: Secondary | ICD-10-CM | POA: Diagnosis not present

## 2017-07-31 DIAGNOSIS — Z01818 Encounter for other preprocedural examination: Secondary | ICD-10-CM | POA: Insufficient documentation

## 2017-07-31 HISTORY — DX: Chronic obstructive pulmonary disease, unspecified: J44.9

## 2017-07-31 HISTORY — DX: Sleep apnea, unspecified: G47.30

## 2017-07-31 HISTORY — DX: Pure hypercholesterolemia, unspecified: E78.00

## 2017-07-31 HISTORY — DX: Unspecified convulsions: R56.9

## 2017-07-31 LAB — BASIC METABOLIC PANEL
Anion gap: 11 (ref 5–15)
BUN: 13 mg/dL (ref 6–20)
CALCIUM: 9.6 mg/dL (ref 8.9–10.3)
CO2: 26 mmol/L (ref 22–32)
Chloride: 101 mmol/L (ref 101–111)
Creatinine, Ser: 0.75 mg/dL (ref 0.44–1.00)
GFR calc non Af Amer: >60 mL/min (ref >60)
Glucose, Bld: 108 mg/dL — ABNORMAL HIGH (ref 65–99)
Potassium: 4 mmol/L (ref 3.5–5.1)
SODIUM: 138 mmol/L (ref 135–145)

## 2017-07-31 LAB — CBC WITH DIFFERENTIAL/PLATELET
BASOS PCT: 1 %
Basophils Absolute: 0 10*3/uL (ref 0.0–0.1)
EOS ABS: 0.1 10*3/uL (ref 0.0–0.7)
Eosinophils Relative: 1 %
HCT: 43.8 % (ref 36.0–46.0)
Hemoglobin: 13.8 g/dL (ref 12.0–15.0)
Lymphocytes Relative: 26 %
Lymphs Abs: 1.6 10*3/uL (ref 0.7–4.0)
MCH: 29.1 pg (ref 26.0–34.0)
MCHC: 31.5 g/dL (ref 30.0–36.0)
MCV: 92.2 fL (ref 78.0–100.0)
MONO ABS: 0.5 10*3/uL (ref 0.1–1.0)
Monocytes Relative: 9 %
Neutro Abs: 3.7 10*3/uL (ref 1.7–7.7)
Neutrophils Relative %: 63 %
Platelets: 194 10*3/uL (ref 150–400)
RBC: 4.75 MIL/uL (ref 3.87–5.11)
RDW: 14.3 % (ref 11.5–15.5)
WBC: 5.9 10*3/uL (ref 4.0–10.5)

## 2017-08-01 NOTE — Progress Notes (Signed)
cc'd to pcp 

## 2017-08-04 NOTE — Progress Notes (Signed)
cc'd to pcp 

## 2017-08-05 ENCOUNTER — Ambulatory Visit (HOSPITAL_COMMUNITY): Payer: Medicare HMO | Admitting: Anesthesiology

## 2017-08-05 ENCOUNTER — Ambulatory Visit (HOSPITAL_COMMUNITY)
Admission: RE | Admit: 2017-08-05 | Discharge: 2017-08-05 | Disposition: A | Discharge status: Home / Self Care | Payer: Medicare HMO | Source: Ambulatory Visit | Attending: Gastroenterology | Admitting: Gastroenterology

## 2017-08-05 ENCOUNTER — Encounter (HOSPITAL_COMMUNITY)
Admission: RE | Disposition: A | Discharge status: Home / Self Care | Payer: Self-pay | Source: Ambulatory Visit | Attending: Gastroenterology

## 2017-08-05 ENCOUNTER — Encounter (HOSPITAL_COMMUNITY): Payer: Self-pay

## 2017-08-05 DIAGNOSIS — E7800 Pure hypercholesterolemia, unspecified: Secondary | ICD-10-CM | POA: Insufficient documentation

## 2017-08-05 DIAGNOSIS — T39015A Adverse effect of aspirin, initial encounter: Secondary | ICD-10-CM | POA: Insufficient documentation

## 2017-08-05 DIAGNOSIS — R195 Other fecal abnormalities: Secondary | ICD-10-CM | POA: Diagnosis not present

## 2017-08-05 DIAGNOSIS — F329 Major depressive disorder, single episode, unspecified: Secondary | ICD-10-CM | POA: Insufficient documentation

## 2017-08-05 DIAGNOSIS — Z79899 Other long term (current) drug therapy: Secondary | ICD-10-CM | POA: Insufficient documentation

## 2017-08-05 DIAGNOSIS — Z888 Allergy status to other drugs, medicaments and biological substances status: Secondary | ICD-10-CM | POA: Insufficient documentation

## 2017-08-05 DIAGNOSIS — H548 Legal blindness, as defined in USA: Secondary | ICD-10-CM | POA: Insufficient documentation

## 2017-08-05 DIAGNOSIS — Z87891 Personal history of nicotine dependence: Secondary | ICD-10-CM | POA: Insufficient documentation

## 2017-08-05 DIAGNOSIS — K449 Diaphragmatic hernia without obstruction or gangrene: Secondary | ICD-10-CM | POA: Diagnosis not present

## 2017-08-05 DIAGNOSIS — Z882 Allergy status to sulfonamides status: Secondary | ICD-10-CM | POA: Diagnosis not present

## 2017-08-05 DIAGNOSIS — K2970 Gastritis, unspecified, without bleeding: Secondary | ICD-10-CM | POA: Diagnosis not present

## 2017-08-05 DIAGNOSIS — J449 Chronic obstructive pulmonary disease, unspecified: Secondary | ICD-10-CM | POA: Diagnosis not present

## 2017-08-05 DIAGNOSIS — Z9981 Dependence on supplemental oxygen: Secondary | ICD-10-CM | POA: Insufficient documentation

## 2017-08-05 DIAGNOSIS — G4730 Sleep apnea, unspecified: Secondary | ICD-10-CM | POA: Diagnosis not present

## 2017-08-05 DIAGNOSIS — K317 Polyp of stomach and duodenum: Secondary | ICD-10-CM | POA: Diagnosis not present

## 2017-08-05 HISTORY — PX: ESOPHAGOGASTRODUODENOSCOPY (EGD) WITH PROPOFOL: SHX5813

## 2017-08-05 SURGERY — ESOPHAGOGASTRODUODENOSCOPY (EGD) WITH PROPOFOL
Anesthesia: General

## 2017-08-05 MED ORDER — MIDAZOLAM HCL 5 MG/5ML IJ SOLN
INTRAMUSCULAR | Status: DC | PRN
  Administered 2017-08-05: 2 mg via INTRAVENOUS

## 2017-08-05 MED ORDER — GLYCOPYRROLATE 0.2 MG/ML IJ SOLN
INTRAMUSCULAR | Status: DC | PRN
  Administered 2017-08-05: 0.2 mg via INTRAVENOUS

## 2017-08-05 MED ORDER — LIDOCAINE VISCOUS 2 % MT SOLN
OROMUCOSAL | Status: AC
  Filled 2017-08-05: qty 15

## 2017-08-05 MED ORDER — LIDOCAINE VISCOUS 2 % MT SOLN
15.0000 mL | Freq: Once | OROMUCOSAL | Status: AC
  Administered 2017-08-05: 15 mL via OROMUCOSAL

## 2017-08-05 MED ORDER — GLYCOPYRROLATE 0.2 MG/ML IJ SOLN
INTRAMUSCULAR | Status: AC
  Filled 2017-08-05: qty 1

## 2017-08-05 MED ORDER — PROPOFOL 500 MG/50ML IV EMUL
INTRAVENOUS | Status: DC | PRN
  Administered 2017-08-05: 150 ug/kg/min via INTRAVENOUS

## 2017-08-05 MED ORDER — PROPOFOL 10 MG/ML IV BOLUS
INTRAVENOUS | Status: AC
  Filled 2017-08-05: qty 40

## 2017-08-05 MED ORDER — ONDANSETRON HCL 4 MG/2ML IJ SOLN
INTRAMUSCULAR | Status: DC | PRN
  Administered 2017-08-05: 4 mg via INTRAVENOUS

## 2017-08-05 MED ORDER — ONDANSETRON HCL 4 MG PO TABS: ORAL_TABLET | ORAL | 1 refills | Status: DC

## 2017-08-05 MED ORDER — MIDAZOLAM HCL 2 MG/2ML IJ SOLN
INTRAMUSCULAR | Status: AC
  Filled 2017-08-05: qty 2

## 2017-08-05 MED ORDER — LACTATED RINGERS IV SOLN
INTRAVENOUS | Status: DC
  Administered 2017-08-05: 10:00:00 via INTRAVENOUS

## 2017-08-05 MED ORDER — ONDANSETRON HCL 4 MG/2ML IJ SOLN
INTRAMUSCULAR | Status: AC
  Filled 2017-08-05: qty 2

## 2017-08-05 MED ORDER — MIDAZOLAM HCL 2 MG/2ML IJ SOLN: 0.5000 mg | INTRAMUSCULAR | Status: DC | PRN

## 2017-08-05 NOTE — Anesthesia Preprocedure Evaluation (Signed)
Anesthesia Evaluation  Patient identified by MRN, date of birth, ID band Patient awake    Reviewed: Allergy & Precautions, NPO status , Patient's Chart, lab work & pertinent test results  Airway Mallampati: I  TM Distance: >3 FB Neck ROM: Full    Dental no notable dental hx. (+) Edentulous Upper, Edentulous Lower   Pulmonary sleep apnea , COPD, former smoker,  Home O2 prn   Pulmonary exam normal breath sounds clear to auscultation       Cardiovascular Normal cardiovascular exam Rhythm:Regular Rate:Normal     Neuro/Psych  Headaches, Depression    GI/Hepatic   Endo/Other    Renal/GU      Musculoskeletal   Abdominal   Peds  Hematology   Anesthesia Other Findings   Reproductive/Obstetrics                             Anesthesia Physical Anesthesia Plan  ASA: III  Anesthesia Plan: General   Post-op Pain Management:    Induction: Intravenous  PONV Risk Score and Plan:   Airway Management Planned: Nasal Cannula  Additional Equipment:   Intra-op Plan:   Post-operative Plan: Extubation in OR  Informed Consent: I have reviewed the patients History and Physical, chart, labs and discussed the procedure including the risks, benefits and alternatives for the proposed anesthesia with the patient or authorized representative who has indicated his/her understanding and acceptance.   Dental advisory given  Plan Discussed with: CRNA  Anesthesia Plan Comments:         Anesthesia Quick Evaluation

## 2017-08-05 NOTE — Transfer of Care (Signed)
Immediate Anesthesia Transfer of Care Note  Patient: Latoya Poole  Procedure(s) Performed: ESOPHAGOGASTRODUODENOSCOPY (EGD) WITH PROPOFOL (N/A )  Patient Location: PACU  Anesthesia Type:MAC  Level of Consciousness: awake, alert , oriented and patient cooperative  Airway & Oxygen Therapy: Patient Spontanous Breathing  Post-op Assessment: Report given to RN and Post -op Vital signs reviewed and stable  Post vital signs: Reviewed and stable  Last Vitals:  Vitals Value Taken Time  BP    Temp    Pulse 82 08/05/2017 11:01 AM  Resp 34 08/05/2017 11:01 AM  SpO2 93 % 08/05/2017 11:01 AM  Vitals shown include unvalidated device data.  Last Pain:  Vitals:   08/05/17 0931  TempSrc: Oral  PainSc: 0-No pain         Complications: No apparent anesthesia complications

## 2017-08-05 NOTE — H&P (Signed)
Primary Care Physician:  Monico Blitz, MD Primary Gastroenterologist:  Dr. Oneida Alar  Pre-Procedure History & Physical: HPI:  Latoya Poole is a 62 y.o. female here for HEME POS STOOLS. USES NSAIDS.  Past Medical History:  Diagnosis Date  . Breast fibroadenoma    left  . Cellulitis   . Cervicogenic headache 10/07/2013  . COPD (chronic obstructive pulmonary disease) ([place])   . Depressive disorder   . H/O: substance abuse 2001   xanax  . Herniated disc, cervical 1998  . Hypercholesteremia   . 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 Seizures ([place])    had 1 seizure, "years ago", but unknown orgin and on no meds  . Sleep apnea    Uses o2 PRN, not sure of liters    Past Surgical History:  Procedure Laterality Date  . BREAST FIBROADENOMA SURGERY Left   . NECK SURGERY     cervical disc  . TUBAL LIGATION Bilateral     Prior to Admission medications   Medication Sig Start Date End Date Taking? Authorizing Provider  albuterol (ACCUNEB) 1.25 MG/3ML nebulizer solution Take 1 ampule by nebulization every 6 (six) hours as needed for wheezing or shortness of breath.   Yes [provider]  albuterol (PROVENTIL HFA;VENTOLIN HFA) 108 (90 Base) MCG/ACT inhaler Inhale 2 puffs into the lungs every 6 (six) hours as needed for wheezing or shortness of breath.   Yes [provider]  Aspirin Effervescent (ALKA-SELTZER ORIGINAL PO) Take 2 tablets by mouth every 6 (six) hours as needed for indigestion or pain. 06/12/17  Yes [provider]  budesonide-formoterol (SYMBICORT) 160-4.5 MCG/ACT inhaler Inhale 2 puffs into the lungs 2 (two) times daily.   Yes [provider]  CHANTIX STARTING MONTH PAK 0.5 MG X 11 & 1 MG X 42 tablet Take 0.5-1 mg by mouth daily as needed for smoking cessation. Desired/urge to smoke 05/28/17  Yes [provider]  clonazePAM (KLONOPIN) 0.5 MG tablet Take 0.5 mg by mouth daily as needed for anxiety.  06/06/17  Yes  [provider]  Fructose-Dextrose-Phosphor Acd (GNP NAUSEA RELIEF) 1.87-1.87-21.5 SOLN Take 30 mLs by mouth every 15 (fifteen) minutes as needed for nausea/vomiting. 06/12/17  Yes [provider]  omeprazole (PRILOSEC) 20 MG capsule Take 1 capsule (20 mg total) by mouth daily. 06/21/16  Yes Kathrynn Ducking, MD  OXYGEN Inhale into the lungs as needed (for respiratory issues.).   Yes [provider]  pregabalin (LYRICA) 50 MG capsule Take 1 capsule (50 mg total) by mouth 2 (two) times daily. 01/09/16  Yes Ward Givens, NP  QUEtiapine (SEROQUEL) 100 MG tablet Take 1 tablet (100 mg total) by mouth at bedtime. Patient taking differently: Take 100 mg by mouth at bedtime as needed (for sleep.).  06/21/16  Yes Kathrynn Ducking, MD  rosuvastatin (CRESTOR) 10 MG tablet Take 10 mg by mouth daily.   Yes [provider]  TYLENOL 500 MG tablet Take 1,000 mg by mouth every 6 (six) hours as needed. For pain/fever. 05/20/17  Yes [provider]    Allergies as of 07/02/2017 - Review Complete 07/02/2017  Allergen Reaction Noted  . Gabapentin  10/18/2013  . Lyrica [pregabalin]  10/25/2013  . Sulfa antibiotics Hives 10/07/2013    Family History  Problem Relation Age of Onset  . Cancer Mother   . Cirrhosis Father   . Colon cancer Neg Hx   . Colon polyps Neg Hx  Social History   Socioeconomic History  . Marital status: Divorced    Spouse name: Not on file  . Number of children: 2  . Years of education: 11th  . Highest education level: Not on file  Occupational History  . Occupation: disabiltiy  Social Needs  . Financial resource strain: Not on file  . Food insecurity:    Worry: Not on file    Inability: Not on file  . Transportation needs:    Medical: Not on file    Non-medical: Not on file  Tobacco Use  . Smoking status: Former Smoker    Packs/day: 1.00    Years: 30.00    Pack years: 30.00    Types: Cigarettes    Last attempt to quit:  06/04/2017    Years since quitting: 0.1  . Smokeless tobacco: Never Used  Substance and Sexual Activity  . Alcohol use: No    Alcohol/week: 0.0 oz    Comment: former alcohol abuse quit 2001  . Drug use: No  . Sexual activity: Yes    Birth control/protection: Post-menopausal  Lifestyle  . Physical activity:    Days per week: Not on file    Minutes per session: Not on file  . Stress: Not on file  Relationships  . Social connections:    Talks on phone: Not on file    Gets together: Not on file    Attends religious service: Not on file    Active member of club or organization: Not on file    Attends meetings of clubs or organizations: Not on file    Relationship status: Not on file  . Intimate partner violence:    Fear of current or ex partner: Not on file    Emotionally abused: Not on file    Physically abused: Not on file    Forced sexual activity: Not on file  Other Topics Concern  . Not on file  Social History Narrative   Patient lives at home with son. USED TO BE A SUPERVISOR FOR A [employer]   Patient has 2 children.    Patient on disability.    Patient is right handed.    Patient is divorced.    Education 11 th .   Patient drinks about 6 cups of caffeine daily.    Review of Systems: See HPI, otherwise negative ROS   Physical Exam: BP 111/68   Pulse 84   Temp 97.7 F (36.5 C) (Oral)   Resp 20   SpO2 97%  General:   Alert,  pleasant and cooperative in NAD Head:  Normocephalic and atraumatic. Neck:  Supple; Lungs:  Clear throughout to auscultation.    Heart:  Regular rate and rhythm. Abdomen:  Soft, nontender and nondistended. Normal bowel sounds, without guarding, and without rebound.   Neurologic:  Alert and  oriented x4;  grossly normal neurologically.  Impression/Plan:     HEME POS STOOLS  PLAN:  1.EGD TODAY. DISCUSSED PROCEDURE, BENEFITS, & RISKS: < 1% chance of medication reaction, bleeding, OR perforation.

## 2017-08-05 NOTE — Op Note (Signed)
[facility] Patient Name: Latoya Poole Procedure Date: 08/05/2017 10:19 AM MRN: [id number] Date of Birth: 01/07/1956 Attending MD: Barney Drain MD, MD CSN: [id number] Age: 62 Admit Type: Outpatient Procedure:                Upper GI endoscopy WITH COLD FORCEPS BIOPSY Indications:              Heme positive stool. LAST TCS 2016:                            HYPEPRLATIC/BENIGN POLYPOID LESIONS REMOVED Providers:                Barney Drain MD, MD, Janeece Riggers, RN, Nelma Rothman,                            Technician Referring MD:             Fuller Canada Manuella Ghazi MD, MD Medicines:                Propofol per Anesthesia Complications:            No immediate complications. Estimated Blood Loss:     Estimated blood loss was minimal. Procedure:                Pre-Anesthesia Assessment:                           - Prior to the procedure, a History and Physical                            was performed, and patient medications and                            allergies were reviewed. The patient's tolerance of                            previous anesthesia was also reviewed. The risks                            and benefits of the procedure and the sedation                            options and risks were discussed with the patient.                            All questions were answered, and informed consent                            was obtained. Prior Anticoagulants: The patient has                            taken aspirin, last dose was 7 days prior to                            procedure. ASA Grade Assessment: II - A patient  with mild systemic disease. After reviewing the                            risks and benefits, the patient was deemed in                            satisfactory condition to undergo the procedure.                            After obtaining informed consent, the endoscope was                            passed under direct vision. Throughout the                        procedure, the patient's blood pressure, pulse, and                            oxygen saturations were monitored continuously. The                            [id number]) scope was introduced through the                            mouth, and advanced to the second part of duodenum.                            The upper GI endoscopy was accomplished without                            difficulty. The patient tolerated the procedure                            well. Scope In: 10:47:09 AM Scope Out: 10:55:09 AM Total Procedure Duration: 0 hours 8 minutes 0 seconds  Findings:      The examined esophagus was normal.      A small hiatal hernia was present.      Diffuse mild inflammation characterized by congestion (edema) and       erythema was found on the greater curvature of the stomach and in the       gastric antrum. Biopsies were taken with a cold forceps for Helicobacter       pylori testing.      Multiple small sessile polyps with no stigmata of recent bleeding were       found in the cardia and in the gastric fundus. The polyp was removed       with a cold biopsy forceps. Resection and retrieval were complete.      The examined duodenum was normal. Impression:               - Small hiatal hernia.                           - HEME POSITIVE STOOL DUE TO MILD Gastritis DUE TO  ASA.                           - Multiple gastric polyps. Moderate Sedation:      Per Anesthesia Care Recommendation:           - Await pathology results.                           - Continue present medications.                           - High fiber diet and low fat diet.                           - Return to my office in 4 months.                           - Patient has a contact number available for                            emergencies. The signs and symptoms of potential                            delayed complications were discussed with the                             patient. Return to normal activities tomorrow.                            Written discharge instructions were provided to the                            patient. Procedure Code(s):        --- Professional ---                           [phone], Esophagogastroduodenoscopy, flexible,                            transoral; with biopsy, single or multiple Diagnosis Code(s):        --- Professional ---                           K44.9, Diaphragmatic hernia without obstruction or                            gangrene                           K29.70, Gastritis, unspecified, without bleeding                           K31.7, Polyp of stomach and duodenum                           R19.5, Other fecal abnormalities CPT copyright 2017 American [employer] Association. All rights reserved. The  codes documented in this report are preliminary and upon coder review may  be revised to meet current compliance requirements. Barney Drain, MD Barney Drain MD, MD 08/05/2017 11:03:02 AM This report has been signed electronically. Number of Addenda: 0

## 2017-08-05 NOTE — Anesthesia Postprocedure Evaluation (Signed)
Anesthesia Post Note  Patient: Latoya Poole  Procedure(s) Performed: ESOPHAGOGASTRODUODENOSCOPY (EGD) WITH PROPOFOL (N/A )  Patient location during evaluation: PACU Anesthesia Type: General Level of consciousness: awake and alert, oriented and patient cooperative Pain management: pain level controlled Vital Signs Assessment: post-procedure vital signs reviewed and stable Respiratory status: spontaneous breathing and respiratory function stable Cardiovascular status: stable Postop Assessment: no apparent nausea or vomiting Anesthetic complications: no     Last Vitals:  Vitals:   08/05/17 0931  BP: 111/68  Pulse: 84  Resp: 20  Temp: 36.5 C  SpO2: 97%    Last Pain:  Vitals:   08/05/17 0931  TempSrc: Oral  PainSc: 0-No pain                 ADAMS, AMY A

## 2017-08-05 NOTE — Discharge Instructions (Signed)
You have a small hiatal hernia, benign appearing stomach polyps, and mild gastritis due to aspirin use. The blood detected in your stool can be due to hemorrhoids or gastritis.  I biopsied your stomach.   CONTINUE OMEPRAZOLE 30 MINUTES PRIOR TO MEALS ONCE DAILY.  AVOID TRIGGERS FOR GASTRITIS. SEE INFO BELOW.  FOLLOW A LOW FAT DIET. SEE INFO BELOW.  FOLLOW UP IN 4 MOS.  UPPER ENDOSCOPY AFTER CARE Read the instructions outlined below and refer to this sheet in the next week. These discharge instructions provide you with general information on caring for yourself after you leave the hospital. While your treatment has been planned according to the most current medical practices available, unavoidable complications occasionally occur. If you have any problems or questions after discharge, call DR. Doyce Saling, [phone].  ACTIVITY  You may resume your regular activity, but move at a slower pace for the next 24 hours.   Take frequent rest periods for the next 24 hours.   Walking will help get rid of the air and reduce the bloated feeling in your belly (abdomen).   No driving for 24 hours (because of the medicine (anesthesia) used during the test).   You may shower.   Do not sign any important legal documents or operate any machinery for 24 hours (because of the anesthesia used during the test).    NUTRITION  Drink plenty of fluids.   You may resume your normal diet as instructed by your doctor.   Begin with a light meal and progress to your normal diet. Heavy or fried foods are harder to digest and may make you feel sick to your stomach (nauseated).   Avoid alcoholic beverages for 24 hours or as instructed.    MEDICATIONS  You may resume your normal medications.   WHAT YOU CAN EXPECT TODAY  Some feelings of bloating in the abdomen.   Passage of more gas than usual.    IF YOU HAD A BIOPSY TAKEN DURING THE UPPER ENDOSCOPY:  Eat a soft diet IF YOU HAVE NAUSEA, BLOATING,  ABDOMINAL PAIN, OR VOMITING.    FINDING OUT THE RESULTS OF YOUR TEST Not all test results are available during your visit. DR. Oneida Alar WILL CALL YOU WITHIN 14 DAYS OF YOUR PROCEDUE WITH YOUR RESULTS. Do not assume everything is normal if you have not heard from DR. Jylian Pappalardo, CALL HER OFFICE AT [phone].  SEEK IMMEDIATE MEDICAL ATTENTION AND CALL THE OFFICE: [phone] IF:  You have more than a spotting of blood in your stool.   Your belly is swollen (abdominal distention).   You are nauseated or vomiting.   You have a temperature over 101F.   You have abdominal pain or discomfort that is severe or gets worse throughout the day.   Gastritis  Gastritis is an inflammation (the body's way of reacting to injury and/or infection) of the stomach. It is often caused by viral or bacterial (germ) infections. It can also be caused BY ASPIRIN, BC/GOODY POWDER'S, (IBUPROFEN) MOTRIN, OR ALEVE (NAPROXEN), chemicals (including alcohol), SPICY FOODS, and medications. This illness may be associated with generalized malaise (feeling tired, not well), UPPER ABDOMINAL STOMACH cramps, and fever. One common bacterial cause of gastritis is an organism known as H. Pylori. This can be treated with antibiotics.    Low-Fat Diet  BREADS, CEREALS, PASTA, RICE, DRIED PEAS, AND BEANS These products are high in carbohydrates and most are low in fat. Therefore, they can be increased in the diet as substitutes for fatty foods. They  too, however, contain calories and should not be eaten in excess. Cereals can be eaten for snacks as well as for breakfast.  Include foods that contain fiber (fruits, vegetables, whole grains, and legumes). Research shows that fiber may lower blood cholesterol levels, especially the water-soluble fiber found in fruits, vegetables, oat products, and legumes.  FRUITS AND VEGETABLES It is good to eat fruits and vegetables. Besides being sources of fiber, both are rich in vitamins and some  minerals. They help you get the daily allowances of these nutrients. Fruits and vegetables can be used for snacks and desserts.  MEATS Limit lean meat, chicken, [place], and fish to no more than 6 ounces per day.  Beef, Pork, and Lamb Use lean cuts of beef, pork, and lamb. Lean cuts include:  Extra-lean ground beef.  Arm roast.  Sirloin tip.  Center-cut ham.  Round steak.  Loin chops.  Rump roast.  Tenderloin.  Trim all fat off the outside of meats before cooking. It is not necessary to severely decrease the intake of red meat, but lean choices should be made. Lean meat is rich in protein and contains a highly absorbable form of iron. Premenopausal women, in particular, should avoid reducing lean red meat because this could increase the risk for low red blood cells (iron-deficiency anemia).  Chicken and [place] These are good sources of protein. The fat of poultry can be reduced by removing the skin and underlying fat layers before cooking. Chicken and [place] can be substituted for lean red meat in the diet. Poultry should not be fried or covered with high-fat sauces.  Fish and Shellfish Fish is a good source of protein. Shellfish contain cholesterol, but they usually are low in saturated fatty acids. The preparation of fish is important. Like chicken and [place], they should not be fried or covered with high-fat sauces.  EGGS Egg whites contain no fat or cholesterol. They can be eaten often. Try 1 to 2 egg whites instead of whole eggs in recipes or use egg substitutes that do not contain yolk.  MILK AND DAIRY PRODUCTS Use skim or 1% milk instead of 2% or whole milk. Decrease whole milk, natural, and processed cheeses. Use nonfat or low-fat (2%) cottage cheese or low-fat cheeses made from vegetable oils. Choose nonfat or low-fat (1 to 2%) yogurt. Experiment with evaporated skim milk in recipes that call for heavy cream. Substitute low-fat yogurt or low-fat cottage cheese for sour cream in  dips and salad dressings. Have at least 2 servings of low-fat dairy products, such as 2 glasses of skim (or 1%) milk each day to help get your daily calcium intake.  FATS AND OILS Reduce the total intake of fats, especially saturated fat. Butterfat, lard, and beef fats are high in saturated fat and cholesterol. These should be avoided as much as possible. Vegetable fats do not contain cholesterol, but certain vegetable fats, such as coconut oil, palm oil, and palm kernel oil are very high in saturated fats. These should be limited. These fats are often used in bakery goods, processed foods, popcorn, oils, and nondairy creamers. Vegetable shortenings and some peanut butters contain hydrogenated oils, which are also saturated fats. Read the labels on these foods and check for saturated vegetable oils.  Unsaturated vegetable oils and fats do not raise blood cholesterol. However, they should be limited because they are fats and are high in calories. Total fat should still be limited to 30% of your daily caloric intake. Desirable liquid vegetable oils are corn  oil, cottonseed oil, olive oil, canola oil, safflower oil, soybean oil, and sunflower oil. Peanut oil is not as good, but small amounts are acceptable. Buy a heart-healthy tub margarine that has no partially hydrogenated oils in the ingredients. Mayonnaise and salad dressings often are made from unsaturated fats, but they should also be limited because of their high calorie and fat content. Seeds, nuts, peanut butter, olives, and avocados are high in fat, but the fat is mainly the unsaturated type. These foods should be limited mainly to avoid excess calories and fat.  OTHER EATING TIPS Snacks  Most sweets should be limited as snacks. They tend to be rich in calories and fats, and their caloric content outweighs their nutritional value. Some good choices in snacks are graham crackers, melba toast, soda crackers, bagels (no egg), English muffins, fruits,  and vegetables. These snacks are preferable to snack crackers, [place] fries, and chips. Popcorn should be air-popped or cooked in small amounts of liquid vegetable oil.  Desserts Eat fruit, low-fat yogurt, and fruit ices instead of pastries, cake, and cookies. Sherbet, angel food cake, gelatin dessert, frozen low-fat yogurt, or other frozen products that do not contain saturated fat (pure fruit juice bars, frozen ice pops) are also acceptable.   COOKING METHODS Choose those methods that use little or no fat. They include: Poaching.  Braising.  Steaming.  Grilling.  Baking.  Stir-frying.  Broiling.  Microwaving.  Foods can be cooked in a nonstick pan without added fat, or use a nonfat cooking spray in regular cookware. Limit fried foods and avoid frying in saturated fat. Add moisture to lean meats by using water, broth, cooking wines, and other nonfat or low-fat sauces along with the cooking methods mentioned above. Soups and stews should be chilled after cooking. The fat that forms on top after a few hours in the refrigerator should be skimmed off. When preparing meals, avoid using excess salt. Salt can contribute to raising blood pressure in some people.  EATING AWAY FROM HOME Order entres, potatoes, and vegetables without sauces or butter. When meat exceeds the size of a deck of cards (3 to 4 ounces), the rest can be taken home for another meal. Choose vegetable or fruit salads and ask for low-calorie salad dressings to be served on the side. Use dressings sparingly. Limit high-fat toppings, such as bacon, crumbled eggs, cheese, sunflower seeds, and olives. Ask for heart-healthy tub margarine instead of butter.

## 2017-08-05 NOTE — Anesthesia Procedure Notes (Signed)
Procedure Name: MAC Date/Time: 08/05/2017 10:36 AM Performed by: Andree Elk Amy A, CRNA Pre-anesthesia Checklist: Patient identified, Emergency Drugs available, Suction available, Patient being monitored and Timeout performed Oxygen Delivery Method: Simple face mask

## 2017-08-07 ENCOUNTER — Telehealth: Payer: Self-pay | Admitting: Gastroenterology

## 2017-08-07 NOTE — Telephone Encounter (Signed)
PATIENT SCHEDULED  °

## 2017-08-07 NOTE — Telephone Encounter (Addendum)
PLEASE CALL PT.SHE HAD BENIGN POLYPS REMOVED FROM HER STOMACH. SHE DOES NOT HAVE H PYLORI GASTRITIS. NO ADDITIONAL WORKUP NEEDED AT THIS TIME.    CONTINUE OMEPRAZOLE 30 MINUTES PRIOR TO MEALS ONCE DAILY.  FOLLOW A LOW FAT DIET.  CBC ONE WEEK PRIOR TO NEXT OUTPATIENT VISIT. FOLLOW UP IN 4 MOS E30 FG POLYPS/GASTRITIS .

## 2017-08-07 NOTE — Telephone Encounter (Signed)
[phone] PLEASE CALL PATIENT, SHE HAD SOME QUESTIONS ABOUT THE POLYPS THAT WERE REMOVED AND HER STOMACH INFLAMMATION.

## 2017-08-07 NOTE — Telephone Encounter (Signed)
PT is aware we will call when pathology is back. She is aware to take her Omeprazole.

## 2017-08-08 ENCOUNTER — Other Ambulatory Visit: Payer: Self-pay

## 2017-08-08 DIAGNOSIS — R195 Other fecal abnormalities: Secondary | ICD-10-CM

## 2017-08-08 NOTE — Telephone Encounter (Signed)
Pt is aware and lab order on file for the end of July.

## 2017-08-11 ENCOUNTER — Encounter (HOSPITAL_COMMUNITY): Payer: Self-pay | Admitting: Gastroenterology

## 2017-10-04 ENCOUNTER — Other Ambulatory Visit: Payer: Self-pay | Admitting: Gastroenterology

## 2017-10-07 ENCOUNTER — Other Ambulatory Visit: Payer: Self-pay | Admitting: *Deleted

## 2017-10-07 ENCOUNTER — Encounter: Payer: Self-pay | Admitting: *Deleted

## 2017-10-07 DIAGNOSIS — R195 Other fecal abnormalities: Secondary | ICD-10-CM

## 2017-11-26 DIAGNOSIS — M542 Cervicalgia: Secondary | ICD-10-CM | POA: Diagnosis not present

## 2017-11-26 DIAGNOSIS — Z6825 Body mass index (BMI) 25.0-25.9, adult: Secondary | ICD-10-CM | POA: Diagnosis not present

## 2017-11-26 DIAGNOSIS — K219 Gastro-esophageal reflux disease without esophagitis: Secondary | ICD-10-CM | POA: Diagnosis not present

## 2017-11-26 DIAGNOSIS — J449 Chronic obstructive pulmonary disease, unspecified: Secondary | ICD-10-CM | POA: Diagnosis not present

## 2017-11-26 DIAGNOSIS — F329 Major depressive disorder, single episode, unspecified: Secondary | ICD-10-CM | POA: Diagnosis not present

## 2017-11-26 DIAGNOSIS — Z299 Encounter for prophylactic measures, unspecified: Secondary | ICD-10-CM | POA: Diagnosis not present

## 2017-12-01 ENCOUNTER — Other Ambulatory Visit (HOSPITAL_COMMUNITY)
Admission: RE | Admit: 2017-12-01 | Discharge: 2017-12-01 | Disposition: A | Discharge status: Home / Self Care | Payer: Medicare HMO | Source: Ambulatory Visit | Attending: Gastroenterology | Admitting: Gastroenterology

## 2017-12-01 ENCOUNTER — Other Ambulatory Visit: Payer: Self-pay

## 2017-12-01 DIAGNOSIS — R195 Other fecal abnormalities: Secondary | ICD-10-CM | POA: Diagnosis not present

## 2017-12-01 DIAGNOSIS — D691 Qualitative platelet defects: Secondary | ICD-10-CM

## 2017-12-01 LAB — CBC WITH DIFFERENTIAL/PLATELET
Basophils Absolute: 0 10*3/uL (ref 0.0–0.1)
Basophils Relative: 1 %
Eosinophils Absolute: 0.1 10*3/uL (ref 0.0–0.7)
Eosinophils Relative: 1 %
HCT: 42.2 % (ref 36.0–46.0)
Hemoglobin: 14.1 g/dL (ref 12.0–15.0)
LYMPHS PCT: 36 %
Lymphs Abs: 1.8 10*3/uL (ref 0.7–4.0)
MCH: 29.9 pg (ref 26.0–34.0)
MCHC: 33.4 g/dL (ref 30.0–36.0)
MCV: 89.4 fL (ref 78.0–100.0)
MONOS PCT: 10 %
Monocytes Absolute: 0.5 10*3/uL (ref 0.1–1.0)
Neutro Abs: 2.6 10*3/uL (ref 1.7–7.7)
Neutrophils Relative %: 52 %
Platelets: 147 10*3/uL — ABNORMAL LOW (ref 150–400)
RBC: 4.72 MIL/uL (ref 3.87–5.11)
RDW: 15.4 % (ref 11.5–15.5)
WBC: 5 10*3/uL (ref 4.0–10.5)

## 2017-12-01 NOTE — Progress Notes (Signed)
Pt is aware and labs on file to recheck in 6 months.

## 2017-12-01 NOTE — Progress Notes (Signed)
CC'D TO PCP °

## 2017-12-05 ENCOUNTER — Ambulatory Visit: Payer: Medicare HMO | Admitting: Gastroenterology

## 2018-01-21 DIAGNOSIS — D696 Thrombocytopenia, unspecified: Secondary | ICD-10-CM | POA: Diagnosis not present

## 2018-02-26 DIAGNOSIS — Z6826 Body mass index (BMI) 26.0-26.9, adult: Secondary | ICD-10-CM | POA: Diagnosis not present

## 2018-02-26 DIAGNOSIS — Z1339 Encounter for screening examination for other mental health and behavioral disorders: Secondary | ICD-10-CM | POA: Diagnosis not present

## 2018-02-26 DIAGNOSIS — Z1331 Encounter for screening for depression: Secondary | ICD-10-CM | POA: Diagnosis not present

## 2018-02-26 DIAGNOSIS — Z299 Encounter for prophylactic measures, unspecified: Secondary | ICD-10-CM | POA: Diagnosis not present

## 2018-02-26 DIAGNOSIS — Z7189 Other specified counseling: Secondary | ICD-10-CM | POA: Diagnosis not present

## 2018-02-26 DIAGNOSIS — R5383 Other fatigue: Secondary | ICD-10-CM | POA: Diagnosis not present

## 2018-02-26 DIAGNOSIS — E7800 Pure hypercholesterolemia, unspecified: Secondary | ICD-10-CM | POA: Diagnosis not present

## 2018-02-26 DIAGNOSIS — Z0000 Encounter for general adult medical examination without abnormal findings: Secondary | ICD-10-CM | POA: Diagnosis not present

## 2018-02-26 DIAGNOSIS — Z1211 Encounter for screening for malignant neoplasm of colon: Secondary | ICD-10-CM | POA: Diagnosis not present

## 2018-03-10 ENCOUNTER — Encounter: Payer: Self-pay | Admitting: Gastroenterology

## 2018-03-10 ENCOUNTER — Telehealth: Payer: Self-pay | Admitting: Gastroenterology

## 2018-03-10 ENCOUNTER — Ambulatory Visit: Payer: Medicare HMO | Admitting: Gastroenterology

## 2018-03-10 NOTE — Telephone Encounter (Signed)
PATIENT WAS A NO SHOW AND LETTER SENT  °

## 2018-04-06 ENCOUNTER — Other Ambulatory Visit: Payer: Self-pay

## 2018-04-06 DIAGNOSIS — D691 Qualitative platelet defects: Secondary | ICD-10-CM

## 2018-06-01 DIAGNOSIS — Z299 Encounter for prophylactic measures, unspecified: Secondary | ICD-10-CM | POA: Diagnosis not present

## 2018-06-01 DIAGNOSIS — K219 Gastro-esophageal reflux disease without esophagitis: Secondary | ICD-10-CM | POA: Diagnosis not present

## 2018-06-01 DIAGNOSIS — F419 Anxiety disorder, unspecified: Secondary | ICD-10-CM | POA: Diagnosis not present

## 2018-06-01 DIAGNOSIS — F17210 Nicotine dependence, cigarettes, uncomplicated: Secondary | ICD-10-CM | POA: Diagnosis not present

## 2018-06-01 DIAGNOSIS — Z2821 Immunization not carried out because of patient refusal: Secondary | ICD-10-CM | POA: Diagnosis not present

## 2018-06-01 DIAGNOSIS — J449 Chronic obstructive pulmonary disease, unspecified: Secondary | ICD-10-CM | POA: Diagnosis not present

## 2018-06-01 DIAGNOSIS — Z6827 Body mass index (BMI) 27.0-27.9, adult: Secondary | ICD-10-CM | POA: Diagnosis not present

## 2018-06-01 DIAGNOSIS — G43909 Migraine, unspecified, not intractable, without status migrainosus: Secondary | ICD-10-CM | POA: Diagnosis not present

## 2019-05-12 DIAGNOSIS — J9602 Acute respiratory failure with hypercapnia: Secondary | ICD-10-CM | POA: Diagnosis not present

## 2019-05-12 DIAGNOSIS — J9611 Chronic respiratory failure with hypoxia: Secondary | ICD-10-CM | POA: Diagnosis not present

## 2019-06-10 DIAGNOSIS — J9611 Chronic respiratory failure with hypoxia: Secondary | ICD-10-CM | POA: Diagnosis not present

## 2019-06-10 DIAGNOSIS — F17210 Nicotine dependence, cigarettes, uncomplicated: Secondary | ICD-10-CM | POA: Diagnosis not present

## 2019-06-10 DIAGNOSIS — E7800 Pure hypercholesterolemia, unspecified: Secondary | ICD-10-CM | POA: Diagnosis not present

## 2019-06-10 DIAGNOSIS — Z299 Encounter for prophylactic measures, unspecified: Secondary | ICD-10-CM | POA: Diagnosis not present

## 2019-06-10 DIAGNOSIS — J449 Chronic obstructive pulmonary disease, unspecified: Secondary | ICD-10-CM | POA: Diagnosis not present

## 2019-06-12 DIAGNOSIS — J9602 Acute respiratory failure with hypercapnia: Secondary | ICD-10-CM | POA: Diagnosis not present

## 2019-06-12 DIAGNOSIS — J9611 Chronic respiratory failure with hypoxia: Secondary | ICD-10-CM | POA: Diagnosis not present

## 2019-07-10 DIAGNOSIS — J9611 Chronic respiratory failure with hypoxia: Secondary | ICD-10-CM | POA: Diagnosis not present

## 2019-07-10 DIAGNOSIS — J9602 Acute respiratory failure with hypercapnia: Secondary | ICD-10-CM | POA: Diagnosis not present

## 2019-08-03 DIAGNOSIS — Z299 Encounter for prophylactic measures, unspecified: Secondary | ICD-10-CM | POA: Diagnosis not present

## 2019-08-03 DIAGNOSIS — J449 Chronic obstructive pulmonary disease, unspecified: Secondary | ICD-10-CM | POA: Diagnosis not present

## 2019-08-03 DIAGNOSIS — S81812A Laceration without foreign body, left lower leg, initial encounter: Secondary | ICD-10-CM | POA: Diagnosis not present

## 2019-08-03 DIAGNOSIS — F17210 Nicotine dependence, cigarettes, uncomplicated: Secondary | ICD-10-CM | POA: Diagnosis not present

## 2019-08-10 DIAGNOSIS — F17210 Nicotine dependence, cigarettes, uncomplicated: Secondary | ICD-10-CM | POA: Diagnosis not present

## 2019-08-10 DIAGNOSIS — J9602 Acute respiratory failure with hypercapnia: Secondary | ICD-10-CM | POA: Diagnosis not present

## 2019-08-10 DIAGNOSIS — S81812A Laceration without foreign body, left lower leg, initial encounter: Secondary | ICD-10-CM | POA: Diagnosis not present

## 2019-08-10 DIAGNOSIS — J449 Chronic obstructive pulmonary disease, unspecified: Secondary | ICD-10-CM | POA: Diagnosis not present

## 2019-08-10 DIAGNOSIS — J9611 Chronic respiratory failure with hypoxia: Secondary | ICD-10-CM | POA: Diagnosis not present

## 2019-08-10 DIAGNOSIS — Z299 Encounter for prophylactic measures, unspecified: Secondary | ICD-10-CM | POA: Diagnosis not present

## 2019-08-18 DIAGNOSIS — Z299 Encounter for prophylactic measures, unspecified: Secondary | ICD-10-CM | POA: Diagnosis not present

## 2019-08-18 DIAGNOSIS — J449 Chronic obstructive pulmonary disease, unspecified: Secondary | ICD-10-CM | POA: Diagnosis not present

## 2019-08-18 DIAGNOSIS — S81812A Laceration without foreign body, left lower leg, initial encounter: Secondary | ICD-10-CM | POA: Diagnosis not present

## 2019-09-07 DIAGNOSIS — K219 Gastro-esophageal reflux disease without esophagitis: Secondary | ICD-10-CM | POA: Diagnosis not present

## 2019-09-07 DIAGNOSIS — M542 Cervicalgia: Secondary | ICD-10-CM | POA: Diagnosis not present

## 2019-09-07 DIAGNOSIS — J449 Chronic obstructive pulmonary disease, unspecified: Secondary | ICD-10-CM | POA: Diagnosis not present

## 2019-09-07 DIAGNOSIS — Z299 Encounter for prophylactic measures, unspecified: Secondary | ICD-10-CM | POA: Diagnosis not present

## 2019-09-09 DIAGNOSIS — J9611 Chronic respiratory failure with hypoxia: Secondary | ICD-10-CM | POA: Diagnosis not present

## 2019-09-09 DIAGNOSIS — J9602 Acute respiratory failure with hypercapnia: Secondary | ICD-10-CM | POA: Diagnosis not present

## 2019-10-10 DIAGNOSIS — J9611 Chronic respiratory failure with hypoxia: Secondary | ICD-10-CM | POA: Diagnosis not present

## 2019-10-10 DIAGNOSIS — J9602 Acute respiratory failure with hypercapnia: Secondary | ICD-10-CM | POA: Diagnosis not present

## 2019-11-09 DIAGNOSIS — J9611 Chronic respiratory failure with hypoxia: Secondary | ICD-10-CM | POA: Diagnosis not present

## 2019-11-09 DIAGNOSIS — J9602 Acute respiratory failure with hypercapnia: Secondary | ICD-10-CM | POA: Diagnosis not present

## 2019-12-10 DIAGNOSIS — F17210 Nicotine dependence, cigarettes, uncomplicated: Secondary | ICD-10-CM | POA: Diagnosis not present

## 2019-12-10 DIAGNOSIS — J9602 Acute respiratory failure with hypercapnia: Secondary | ICD-10-CM | POA: Diagnosis not present

## 2019-12-10 DIAGNOSIS — Z299 Encounter for prophylactic measures, unspecified: Secondary | ICD-10-CM | POA: Diagnosis not present

## 2019-12-10 DIAGNOSIS — D696 Thrombocytopenia, unspecified: Secondary | ICD-10-CM | POA: Diagnosis not present

## 2019-12-10 DIAGNOSIS — J449 Chronic obstructive pulmonary disease, unspecified: Secondary | ICD-10-CM | POA: Diagnosis not present

## 2019-12-10 DIAGNOSIS — J9611 Chronic respiratory failure with hypoxia: Secondary | ICD-10-CM | POA: Diagnosis not present

## 2019-12-10 DIAGNOSIS — E7800 Pure hypercholesterolemia, unspecified: Secondary | ICD-10-CM | POA: Diagnosis not present

## 2020-01-10 DIAGNOSIS — J9611 Chronic respiratory failure with hypoxia: Secondary | ICD-10-CM | POA: Diagnosis not present

## 2020-01-10 DIAGNOSIS — J9602 Acute respiratory failure with hypercapnia: Secondary | ICD-10-CM | POA: Diagnosis not present

## 2020-02-09 DIAGNOSIS — J9602 Acute respiratory failure with hypercapnia: Secondary | ICD-10-CM | POA: Diagnosis not present

## 2020-02-09 DIAGNOSIS — J9611 Chronic respiratory failure with hypoxia: Secondary | ICD-10-CM | POA: Diagnosis not present

## 2020-03-11 DIAGNOSIS — J9611 Chronic respiratory failure with hypoxia: Secondary | ICD-10-CM | POA: Diagnosis not present

## 2020-03-11 DIAGNOSIS — J9602 Acute respiratory failure with hypercapnia: Secondary | ICD-10-CM | POA: Diagnosis not present

## 2020-03-15 DIAGNOSIS — Z299 Encounter for prophylactic measures, unspecified: Secondary | ICD-10-CM | POA: Diagnosis not present

## 2020-03-15 DIAGNOSIS — Z79899 Other long term (current) drug therapy: Secondary | ICD-10-CM | POA: Diagnosis not present

## 2020-03-15 DIAGNOSIS — E7800 Pure hypercholesterolemia, unspecified: Secondary | ICD-10-CM | POA: Diagnosis not present

## 2020-03-15 DIAGNOSIS — Z0000 Encounter for general adult medical examination without abnormal findings: Secondary | ICD-10-CM | POA: Diagnosis not present

## 2020-03-15 DIAGNOSIS — F17210 Nicotine dependence, cigarettes, uncomplicated: Secondary | ICD-10-CM | POA: Diagnosis not present

## 2020-03-15 DIAGNOSIS — Z7189 Other specified counseling: Secondary | ICD-10-CM | POA: Diagnosis not present

## 2020-03-15 DIAGNOSIS — R5383 Other fatigue: Secondary | ICD-10-CM | POA: Diagnosis not present

## 2020-04-10 DIAGNOSIS — J9602 Acute respiratory failure with hypercapnia: Secondary | ICD-10-CM | POA: Diagnosis not present

## 2020-04-10 DIAGNOSIS — J9611 Chronic respiratory failure with hypoxia: Secondary | ICD-10-CM | POA: Diagnosis not present

## 2020-06-15 DIAGNOSIS — F17210 Nicotine dependence, cigarettes, uncomplicated: Secondary | ICD-10-CM | POA: Diagnosis not present

## 2020-06-15 DIAGNOSIS — M542 Cervicalgia: Secondary | ICD-10-CM | POA: Diagnosis not present

## 2020-06-15 DIAGNOSIS — Z299 Encounter for prophylactic measures, unspecified: Secondary | ICD-10-CM | POA: Diagnosis not present

## 2020-06-15 DIAGNOSIS — J449 Chronic obstructive pulmonary disease, unspecified: Secondary | ICD-10-CM | POA: Diagnosis not present

## 2020-08-03 DIAGNOSIS — Z299 Encounter for prophylactic measures, unspecified: Secondary | ICD-10-CM | POA: Diagnosis not present

## 2020-08-03 DIAGNOSIS — J9611 Chronic respiratory failure with hypoxia: Secondary | ICD-10-CM | POA: Diagnosis not present

## 2020-08-03 DIAGNOSIS — R21 Rash and other nonspecific skin eruption: Secondary | ICD-10-CM | POA: Diagnosis not present

## 2020-08-03 DIAGNOSIS — F17210 Nicotine dependence, cigarettes, uncomplicated: Secondary | ICD-10-CM | POA: Diagnosis not present

## 2020-08-03 DIAGNOSIS — J449 Chronic obstructive pulmonary disease, unspecified: Secondary | ICD-10-CM | POA: Diagnosis not present

## 2020-09-12 DIAGNOSIS — F17210 Nicotine dependence, cigarettes, uncomplicated: Secondary | ICD-10-CM | POA: Diagnosis not present

## 2020-09-12 DIAGNOSIS — M542 Cervicalgia: Secondary | ICD-10-CM | POA: Diagnosis not present

## 2020-09-12 DIAGNOSIS — J449 Chronic obstructive pulmonary disease, unspecified: Secondary | ICD-10-CM | POA: Diagnosis not present

## 2020-09-12 DIAGNOSIS — Z299 Encounter for prophylactic measures, unspecified: Secondary | ICD-10-CM | POA: Diagnosis not present

## 2020-09-12 DIAGNOSIS — E785 Hyperlipidemia, unspecified: Secondary | ICD-10-CM | POA: Diagnosis not present

## 2020-12-04 ENCOUNTER — Other Ambulatory Visit: Payer: Self-pay | Admitting: Internal Medicine

## 2020-12-04 DIAGNOSIS — Z139 Encounter for screening, unspecified: Secondary | ICD-10-CM

## 2020-12-13 DIAGNOSIS — R110 Nausea: Secondary | ICD-10-CM | POA: Diagnosis not present

## 2020-12-13 DIAGNOSIS — Z6824 Body mass index (BMI) 24.0-24.9, adult: Secondary | ICD-10-CM | POA: Diagnosis not present

## 2020-12-13 DIAGNOSIS — J449 Chronic obstructive pulmonary disease, unspecified: Secondary | ICD-10-CM | POA: Diagnosis not present

## 2020-12-13 DIAGNOSIS — M5440 Lumbago with sciatica, unspecified side: Secondary | ICD-10-CM | POA: Diagnosis not present

## 2020-12-13 DIAGNOSIS — F17210 Nicotine dependence, cigarettes, uncomplicated: Secondary | ICD-10-CM | POA: Diagnosis not present

## 2020-12-13 DIAGNOSIS — S81011A Laceration without foreign body, right knee, initial encounter: Secondary | ICD-10-CM | POA: Diagnosis not present

## 2020-12-13 DIAGNOSIS — Z299 Encounter for prophylactic measures, unspecified: Secondary | ICD-10-CM | POA: Diagnosis not present

## 2021-03-22 DIAGNOSIS — J449 Chronic obstructive pulmonary disease, unspecified: Secondary | ICD-10-CM | POA: Diagnosis not present

## 2021-03-22 DIAGNOSIS — J040 Acute laryngitis: Secondary | ICD-10-CM | POA: Diagnosis not present

## 2021-03-22 DIAGNOSIS — M549 Dorsalgia, unspecified: Secondary | ICD-10-CM | POA: Diagnosis not present

## 2021-03-22 DIAGNOSIS — Z299 Encounter for prophylactic measures, unspecified: Secondary | ICD-10-CM | POA: Diagnosis not present

## 2021-03-22 DIAGNOSIS — F17210 Nicotine dependence, cigarettes, uncomplicated: Secondary | ICD-10-CM | POA: Diagnosis not present

## 2021-04-16 ENCOUNTER — Ambulatory Visit
Admission: RE | Admit: 2021-04-16 | Discharge: 2021-04-16 | Disposition: A | Discharge status: Home / Self Care | Payer: Medicare Other | Source: Ambulatory Visit | Attending: Internal Medicine | Admitting: Internal Medicine

## 2021-04-16 ENCOUNTER — Other Ambulatory Visit: Payer: Self-pay

## 2021-04-16 DIAGNOSIS — Z1231 Encounter for screening mammogram for malignant neoplasm of breast: Secondary | ICD-10-CM | POA: Diagnosis not present

## 2021-04-16 DIAGNOSIS — Z139 Encounter for screening, unspecified: Secondary | ICD-10-CM

## 2021-06-19 DIAGNOSIS — J449 Chronic obstructive pulmonary disease, unspecified: Secondary | ICD-10-CM | POA: Diagnosis not present

## 2021-06-19 DIAGNOSIS — Z299 Encounter for prophylactic measures, unspecified: Secondary | ICD-10-CM | POA: Diagnosis not present

## 2021-06-19 DIAGNOSIS — Z6823 Body mass index (BMI) 23.0-23.9, adult: Secondary | ICD-10-CM | POA: Diagnosis not present

## 2021-06-19 DIAGNOSIS — F17210 Nicotine dependence, cigarettes, uncomplicated: Secondary | ICD-10-CM | POA: Diagnosis not present

## 2021-06-19 DIAGNOSIS — B370 Candidal stomatitis: Secondary | ICD-10-CM | POA: Diagnosis not present

## 2021-06-19 DIAGNOSIS — M542 Cervicalgia: Secondary | ICD-10-CM | POA: Diagnosis not present

## 2021-06-19 DIAGNOSIS — M545 Low back pain, unspecified: Secondary | ICD-10-CM | POA: Diagnosis not present

## 2021-09-11 DIAGNOSIS — Z299 Encounter for prophylactic measures, unspecified: Secondary | ICD-10-CM | POA: Diagnosis not present

## 2021-09-11 DIAGNOSIS — M549 Dorsalgia, unspecified: Secondary | ICD-10-CM | POA: Diagnosis not present

## 2021-09-11 DIAGNOSIS — F32A Depression, unspecified: Secondary | ICD-10-CM | POA: Diagnosis not present

## 2021-09-11 DIAGNOSIS — J449 Chronic obstructive pulmonary disease, unspecified: Secondary | ICD-10-CM | POA: Diagnosis not present

## 2021-09-11 DIAGNOSIS — R5383 Other fatigue: Secondary | ICD-10-CM | POA: Diagnosis not present

## 2021-09-18 DIAGNOSIS — Z299 Encounter for prophylactic measures, unspecified: Secondary | ICD-10-CM | POA: Diagnosis not present

## 2021-09-18 DIAGNOSIS — R5383 Other fatigue: Secondary | ICD-10-CM | POA: Diagnosis not present

## 2021-09-18 DIAGNOSIS — M549 Dorsalgia, unspecified: Secondary | ICD-10-CM | POA: Diagnosis not present

## 2021-09-18 DIAGNOSIS — F17210 Nicotine dependence, cigarettes, uncomplicated: Secondary | ICD-10-CM | POA: Diagnosis not present

## 2021-09-18 DIAGNOSIS — E785 Hyperlipidemia, unspecified: Secondary | ICD-10-CM | POA: Diagnosis not present

## 2021-11-08 DIAGNOSIS — R3914 Feeling of incomplete bladder emptying: Secondary | ICD-10-CM | POA: Diagnosis not present

## 2021-12-04 DIAGNOSIS — J449 Chronic obstructive pulmonary disease, unspecified: Secondary | ICD-10-CM | POA: Diagnosis not present

## 2021-12-04 DIAGNOSIS — F17210 Nicotine dependence, cigarettes, uncomplicated: Secondary | ICD-10-CM | POA: Diagnosis not present

## 2021-12-04 DIAGNOSIS — R609 Edema, unspecified: Secondary | ICD-10-CM | POA: Diagnosis not present

## 2021-12-04 DIAGNOSIS — M549 Dorsalgia, unspecified: Secondary | ICD-10-CM | POA: Diagnosis not present

## 2021-12-04 DIAGNOSIS — Z299 Encounter for prophylactic measures, unspecified: Secondary | ICD-10-CM | POA: Diagnosis not present

## 2022-02-20 DIAGNOSIS — R0600 Dyspnea, unspecified: Secondary | ICD-10-CM | POA: Diagnosis not present

## 2022-02-20 DIAGNOSIS — J449 Chronic obstructive pulmonary disease, unspecified: Secondary | ICD-10-CM | POA: Diagnosis not present

## 2022-02-20 DIAGNOSIS — R0689 Other abnormalities of breathing: Secondary | ICD-10-CM | POA: Diagnosis not present

## 2022-02-20 DIAGNOSIS — I700 Atherosclerosis of aorta: Secondary | ICD-10-CM | POA: Diagnosis not present

## 2022-02-20 DIAGNOSIS — Z043 Encounter for examination and observation following other accident: Secondary | ICD-10-CM | POA: Diagnosis not present

## 2022-02-20 DIAGNOSIS — R918 Other nonspecific abnormal finding of lung field: Secondary | ICD-10-CM | POA: Diagnosis not present

## 2022-02-20 DIAGNOSIS — F17210 Nicotine dependence, cigarettes, uncomplicated: Secondary | ICD-10-CM | POA: Diagnosis not present

## 2022-02-20 DIAGNOSIS — M546 Pain in thoracic spine: Secondary | ICD-10-CM | POA: Diagnosis not present

## 2022-02-20 DIAGNOSIS — M4322 Fusion of spine, cervical region: Secondary | ICD-10-CM | POA: Diagnosis not present

## 2022-02-20 DIAGNOSIS — M419 Scoliosis, unspecified: Secondary | ICD-10-CM | POA: Diagnosis not present

## 2022-02-20 DIAGNOSIS — S42201A Unspecified fracture of upper end of right humerus, initial encounter for closed fracture: Secondary | ICD-10-CM | POA: Diagnosis not present

## 2022-02-20 DIAGNOSIS — Z9181 History of falling: Secondary | ICD-10-CM | POA: Diagnosis not present

## 2022-02-20 DIAGNOSIS — Z981 Arthrodesis status: Secondary | ICD-10-CM | POA: Diagnosis not present

## 2022-02-20 DIAGNOSIS — M25511 Pain in right shoulder: Secondary | ICD-10-CM | POA: Diagnosis not present

## 2022-02-20 DIAGNOSIS — Z299 Encounter for prophylactic measures, unspecified: Secondary | ICD-10-CM | POA: Diagnosis not present

## 2022-02-20 DIAGNOSIS — R0781 Pleurodynia: Secondary | ICD-10-CM | POA: Diagnosis not present

## 2022-02-20 DIAGNOSIS — M47812 Spondylosis without myelopathy or radiculopathy, cervical region: Secondary | ICD-10-CM | POA: Diagnosis not present

## 2022-03-04 DIAGNOSIS — S42294A Other nondisplaced fracture of upper end of right humerus, initial encounter for closed fracture: Secondary | ICD-10-CM | POA: Diagnosis not present

## 2022-03-06 DIAGNOSIS — Z6824 Body mass index (BMI) 24.0-24.9, adult: Secondary | ICD-10-CM | POA: Diagnosis not present

## 2022-03-06 DIAGNOSIS — Z299 Encounter for prophylactic measures, unspecified: Secondary | ICD-10-CM | POA: Diagnosis not present

## 2022-03-06 DIAGNOSIS — F17210 Nicotine dependence, cigarettes, uncomplicated: Secondary | ICD-10-CM | POA: Diagnosis not present

## 2022-03-06 DIAGNOSIS — Z7189 Other specified counseling: Secondary | ICD-10-CM | POA: Diagnosis not present

## 2022-03-06 DIAGNOSIS — E559 Vitamin D deficiency, unspecified: Secondary | ICD-10-CM | POA: Diagnosis not present

## 2022-03-06 DIAGNOSIS — E785 Hyperlipidemia, unspecified: Secondary | ICD-10-CM | POA: Diagnosis not present

## 2022-03-06 DIAGNOSIS — J449 Chronic obstructive pulmonary disease, unspecified: Secondary | ICD-10-CM | POA: Diagnosis not present

## 2022-03-06 DIAGNOSIS — Z0000 Encounter for general adult medical examination without abnormal findings: Secondary | ICD-10-CM | POA: Diagnosis not present

## 2022-03-06 DIAGNOSIS — M546 Pain in thoracic spine: Secondary | ICD-10-CM | POA: Diagnosis not present

## 2022-03-07 DIAGNOSIS — M25511 Pain in right shoulder: Secondary | ICD-10-CM | POA: Diagnosis not present

## 2022-03-08 DIAGNOSIS — M25511 Pain in right shoulder: Secondary | ICD-10-CM | POA: Diagnosis not present

## 2022-03-15 DIAGNOSIS — S42294A Other nondisplaced fracture of upper end of right humerus, initial encounter for closed fracture: Secondary | ICD-10-CM | POA: Diagnosis not present

## 2022-03-15 DIAGNOSIS — M25511 Pain in right shoulder: Secondary | ICD-10-CM | POA: Diagnosis not present

## 2022-04-04 ENCOUNTER — Other Ambulatory Visit: Payer: Self-pay | Admitting: Internal Medicine

## 2022-04-04 DIAGNOSIS — Z1231 Encounter for screening mammogram for malignant neoplasm of breast: Secondary | ICD-10-CM

## 2022-04-05 DIAGNOSIS — M25511 Pain in right shoulder: Secondary | ICD-10-CM | POA: Diagnosis not present

## 2022-04-24 ENCOUNTER — Ambulatory Visit
Admission: RE | Admit: 2022-04-24 | Discharge: 2022-04-24 | Disposition: A | Discharge status: Home / Self Care | Payer: Medicare Other | Source: Ambulatory Visit | Attending: Internal Medicine | Admitting: Internal Medicine

## 2022-04-24 DIAGNOSIS — Z1231 Encounter for screening mammogram for malignant neoplasm of breast: Secondary | ICD-10-CM | POA: Diagnosis not present

## 2022-06-13 DIAGNOSIS — M549 Dorsalgia, unspecified: Secondary | ICD-10-CM | POA: Diagnosis not present

## 2022-06-13 DIAGNOSIS — R0981 Nasal congestion: Secondary | ICD-10-CM | POA: Diagnosis not present

## 2022-06-13 DIAGNOSIS — J069 Acute upper respiratory infection, unspecified: Secondary | ICD-10-CM | POA: Diagnosis not present

## 2022-06-13 DIAGNOSIS — Z299 Encounter for prophylactic measures, unspecified: Secondary | ICD-10-CM | POA: Diagnosis not present

## 2022-09-11 DIAGNOSIS — Z299 Encounter for prophylactic measures, unspecified: Secondary | ICD-10-CM | POA: Diagnosis not present

## 2022-09-11 DIAGNOSIS — J449 Chronic obstructive pulmonary disease, unspecified: Secondary | ICD-10-CM | POA: Diagnosis not present

## 2022-09-11 DIAGNOSIS — K219 Gastro-esophageal reflux disease without esophagitis: Secondary | ICD-10-CM | POA: Diagnosis not present

## 2022-09-11 DIAGNOSIS — E440 Moderate protein-calorie malnutrition: Secondary | ICD-10-CM | POA: Diagnosis not present

## 2022-11-04 DIAGNOSIS — R52 Pain, unspecified: Secondary | ICD-10-CM | POA: Diagnosis not present

## 2022-11-04 DIAGNOSIS — Z299 Encounter for prophylactic measures, unspecified: Secondary | ICD-10-CM | POA: Diagnosis not present

## 2022-11-04 DIAGNOSIS — S81811A Laceration without foreign body, right lower leg, initial encounter: Secondary | ICD-10-CM | POA: Diagnosis not present

## 2022-11-05 DIAGNOSIS — M1990 Unspecified osteoarthritis, unspecified site: Secondary | ICD-10-CM | POA: Diagnosis not present

## 2022-11-05 DIAGNOSIS — Z9181 History of falling: Secondary | ICD-10-CM | POA: Diagnosis not present

## 2022-11-05 DIAGNOSIS — Z792 Long term (current) use of antibiotics: Secondary | ICD-10-CM | POA: Diagnosis not present

## 2022-11-05 DIAGNOSIS — M79605 Pain in left leg: Secondary | ICD-10-CM | POA: Diagnosis not present

## 2022-11-05 DIAGNOSIS — S81801D Unspecified open wound, right lower leg, subsequent encounter: Secondary | ICD-10-CM | POA: Diagnosis not present

## 2022-11-05 DIAGNOSIS — K219 Gastro-esophageal reflux disease without esophagitis: Secondary | ICD-10-CM | POA: Diagnosis not present

## 2022-11-05 DIAGNOSIS — E785 Hyperlipidemia, unspecified: Secondary | ICD-10-CM | POA: Diagnosis not present

## 2022-11-05 DIAGNOSIS — S81011A Laceration without foreign body, right knee, initial encounter: Secondary | ICD-10-CM | POA: Diagnosis not present

## 2022-11-05 DIAGNOSIS — M79661 Pain in right lower leg: Secondary | ICD-10-CM | POA: Diagnosis not present

## 2022-11-05 DIAGNOSIS — G709 Myoneural disorder, unspecified: Secondary | ICD-10-CM | POA: Diagnosis not present

## 2022-11-05 DIAGNOSIS — Z882 Allergy status to sulfonamides status: Secondary | ICD-10-CM | POA: Diagnosis not present

## 2022-11-05 DIAGNOSIS — M25571 Pain in right ankle and joints of right foot: Secondary | ICD-10-CM | POA: Diagnosis not present

## 2022-11-05 DIAGNOSIS — Z043 Encounter for examination and observation following other accident: Secondary | ICD-10-CM | POA: Diagnosis not present

## 2022-11-05 DIAGNOSIS — S81801A Unspecified open wound, right lower leg, initial encounter: Secondary | ICD-10-CM | POA: Diagnosis not present

## 2022-11-05 DIAGNOSIS — S91011A Laceration without foreign body, right ankle, initial encounter: Secondary | ICD-10-CM | POA: Diagnosis not present

## 2022-11-05 DIAGNOSIS — Z79891 Long term (current) use of opiate analgesic: Secondary | ICD-10-CM | POA: Diagnosis not present

## 2022-11-05 DIAGNOSIS — M25561 Pain in right knee: Secondary | ICD-10-CM | POA: Diagnosis not present

## 2022-11-05 DIAGNOSIS — M79604 Pain in right leg: Secondary | ICD-10-CM | POA: Diagnosis not present

## 2022-11-05 DIAGNOSIS — F32A Depression, unspecified: Secondary | ICD-10-CM | POA: Diagnosis not present

## 2022-11-05 DIAGNOSIS — J449 Chronic obstructive pulmonary disease, unspecified: Secondary | ICD-10-CM | POA: Diagnosis not present

## 2022-11-05 DIAGNOSIS — Z7951 Long term (current) use of inhaled steroids: Secondary | ICD-10-CM | POA: Diagnosis not present

## 2022-11-05 DIAGNOSIS — Z79899 Other long term (current) drug therapy: Secondary | ICD-10-CM | POA: Diagnosis not present

## 2022-11-05 DIAGNOSIS — M7989 Other specified soft tissue disorders: Secondary | ICD-10-CM | POA: Diagnosis not present

## 2022-11-05 DIAGNOSIS — S81811A Laceration without foreign body, right lower leg, initial encounter: Secondary | ICD-10-CM | POA: Diagnosis not present

## 2022-11-11 DIAGNOSIS — Z299 Encounter for prophylactic measures, unspecified: Secondary | ICD-10-CM | POA: Diagnosis not present

## 2022-11-11 DIAGNOSIS — S81811A Laceration without foreign body, right lower leg, initial encounter: Secondary | ICD-10-CM | POA: Diagnosis not present

## 2022-11-11 DIAGNOSIS — R142 Eructation: Secondary | ICD-10-CM | POA: Diagnosis not present

## 2022-11-12 DIAGNOSIS — S81801A Unspecified open wound, right lower leg, initial encounter: Secondary | ICD-10-CM | POA: Diagnosis not present

## 2022-11-12 DIAGNOSIS — Z9181 History of falling: Secondary | ICD-10-CM | POA: Diagnosis not present

## 2022-11-12 DIAGNOSIS — J449 Chronic obstructive pulmonary disease, unspecified: Secondary | ICD-10-CM | POA: Diagnosis not present

## 2022-11-19 DIAGNOSIS — Z9181 History of falling: Secondary | ICD-10-CM | POA: Diagnosis not present

## 2022-11-19 DIAGNOSIS — G709 Myoneural disorder, unspecified: Secondary | ICD-10-CM | POA: Diagnosis not present

## 2022-11-19 DIAGNOSIS — Z792 Long term (current) use of antibiotics: Secondary | ICD-10-CM | POA: Diagnosis not present

## 2022-11-19 DIAGNOSIS — F32A Depression, unspecified: Secondary | ICD-10-CM | POA: Diagnosis not present

## 2022-11-19 DIAGNOSIS — K219 Gastro-esophageal reflux disease without esophagitis: Secondary | ICD-10-CM | POA: Diagnosis not present

## 2022-11-19 DIAGNOSIS — Z79891 Long term (current) use of opiate analgesic: Secondary | ICD-10-CM | POA: Diagnosis not present

## 2022-11-19 DIAGNOSIS — M1990 Unspecified osteoarthritis, unspecified site: Secondary | ICD-10-CM | POA: Diagnosis not present

## 2022-11-19 DIAGNOSIS — Z7951 Long term (current) use of inhaled steroids: Secondary | ICD-10-CM | POA: Diagnosis not present

## 2022-11-19 DIAGNOSIS — Z79899 Other long term (current) drug therapy: Secondary | ICD-10-CM | POA: Diagnosis not present

## 2022-11-19 DIAGNOSIS — J449 Chronic obstructive pulmonary disease, unspecified: Secondary | ICD-10-CM | POA: Diagnosis not present

## 2022-11-19 DIAGNOSIS — S81801A Unspecified open wound, right lower leg, initial encounter: Secondary | ICD-10-CM | POA: Diagnosis not present

## 2022-11-19 DIAGNOSIS — Z882 Allergy status to sulfonamides status: Secondary | ICD-10-CM | POA: Diagnosis not present

## 2022-11-19 DIAGNOSIS — E785 Hyperlipidemia, unspecified: Secondary | ICD-10-CM | POA: Diagnosis not present

## 2022-11-19 DIAGNOSIS — S81801D Unspecified open wound, right lower leg, subsequent encounter: Secondary | ICD-10-CM | POA: Diagnosis not present

## 2022-11-21 DIAGNOSIS — S81811A Laceration without foreign body, right lower leg, initial encounter: Secondary | ICD-10-CM | POA: Diagnosis not present

## 2022-11-25 DIAGNOSIS — S81811A Laceration without foreign body, right lower leg, initial encounter: Secondary | ICD-10-CM | POA: Diagnosis not present

## 2022-11-26 DIAGNOSIS — Z9181 History of falling: Secondary | ICD-10-CM | POA: Diagnosis not present

## 2022-11-26 DIAGNOSIS — S81801A Unspecified open wound, right lower leg, initial encounter: Secondary | ICD-10-CM | POA: Diagnosis not present

## 2022-11-26 DIAGNOSIS — J449 Chronic obstructive pulmonary disease, unspecified: Secondary | ICD-10-CM | POA: Diagnosis not present

## 2022-12-03 DIAGNOSIS — Z9181 History of falling: Secondary | ICD-10-CM | POA: Diagnosis not present

## 2022-12-03 DIAGNOSIS — J449 Chronic obstructive pulmonary disease, unspecified: Secondary | ICD-10-CM | POA: Diagnosis not present

## 2022-12-03 DIAGNOSIS — Z7951 Long term (current) use of inhaled steroids: Secondary | ICD-10-CM | POA: Diagnosis not present

## 2022-12-03 DIAGNOSIS — F32A Depression, unspecified: Secondary | ICD-10-CM | POA: Diagnosis not present

## 2022-12-03 DIAGNOSIS — K219 Gastro-esophageal reflux disease without esophagitis: Secondary | ICD-10-CM | POA: Diagnosis not present

## 2022-12-03 DIAGNOSIS — Z79891 Long term (current) use of opiate analgesic: Secondary | ICD-10-CM | POA: Diagnosis not present

## 2022-12-03 DIAGNOSIS — Z79899 Other long term (current) drug therapy: Secondary | ICD-10-CM | POA: Diagnosis not present

## 2022-12-03 DIAGNOSIS — Z882 Allergy status to sulfonamides status: Secondary | ICD-10-CM | POA: Diagnosis not present

## 2022-12-03 DIAGNOSIS — E785 Hyperlipidemia, unspecified: Secondary | ICD-10-CM | POA: Diagnosis not present

## 2022-12-03 DIAGNOSIS — M1990 Unspecified osteoarthritis, unspecified site: Secondary | ICD-10-CM | POA: Diagnosis not present

## 2022-12-03 DIAGNOSIS — S81801D Unspecified open wound, right lower leg, subsequent encounter: Secondary | ICD-10-CM | POA: Diagnosis not present

## 2022-12-03 DIAGNOSIS — Z792 Long term (current) use of antibiotics: Secondary | ICD-10-CM | POA: Diagnosis not present

## 2022-12-03 DIAGNOSIS — G709 Myoneural disorder, unspecified: Secondary | ICD-10-CM | POA: Diagnosis not present

## 2022-12-10 DIAGNOSIS — S81801A Unspecified open wound, right lower leg, initial encounter: Secondary | ICD-10-CM | POA: Diagnosis not present

## 2022-12-10 DIAGNOSIS — J449 Chronic obstructive pulmonary disease, unspecified: Secondary | ICD-10-CM | POA: Diagnosis not present

## 2022-12-10 DIAGNOSIS — Z9181 History of falling: Secondary | ICD-10-CM | POA: Diagnosis not present

## 2023-01-03 DIAGNOSIS — Z299 Encounter for prophylactic measures, unspecified: Secondary | ICD-10-CM | POA: Diagnosis not present

## 2023-01-03 DIAGNOSIS — M542 Cervicalgia: Secondary | ICD-10-CM | POA: Diagnosis not present

## 2023-01-03 DIAGNOSIS — S80819A Abrasion, unspecified lower leg, initial encounter: Secondary | ICD-10-CM | POA: Diagnosis not present

## 2023-01-07 DIAGNOSIS — S81801A Unspecified open wound, right lower leg, initial encounter: Secondary | ICD-10-CM | POA: Diagnosis not present

## 2023-01-07 DIAGNOSIS — Z9181 History of falling: Secondary | ICD-10-CM | POA: Diagnosis not present

## 2023-01-07 DIAGNOSIS — J449 Chronic obstructive pulmonary disease, unspecified: Secondary | ICD-10-CM | POA: Diagnosis not present

## 2023-04-21 IMAGING — MG MM DIGITAL SCREENING BILAT W/ TOMO AND CAD
8 series · 9 of 24 positions shown · non-contrast
Comparison: Previous exam(s).

CLINICAL DATA: Screening.

EXAM:
DIGITAL SCREENING BILATERAL MAMMOGRAM WITH TOMOSYNTHESIS AND CAD
TECHNIQUE: Bilateral screening digital craniocaudal and mediolateral oblique
mammograms were obtained. Bilateral screening digital breast
tomosynthesis was performed. The images were evaluated with
computer-aided detection.

[L CC synth-2D]
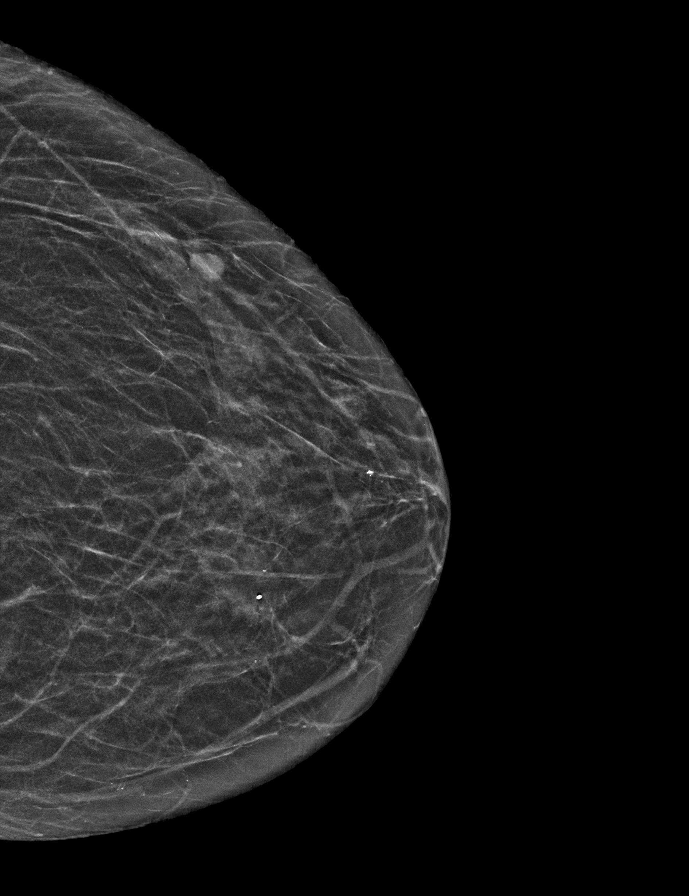

[R CC synth-2D]
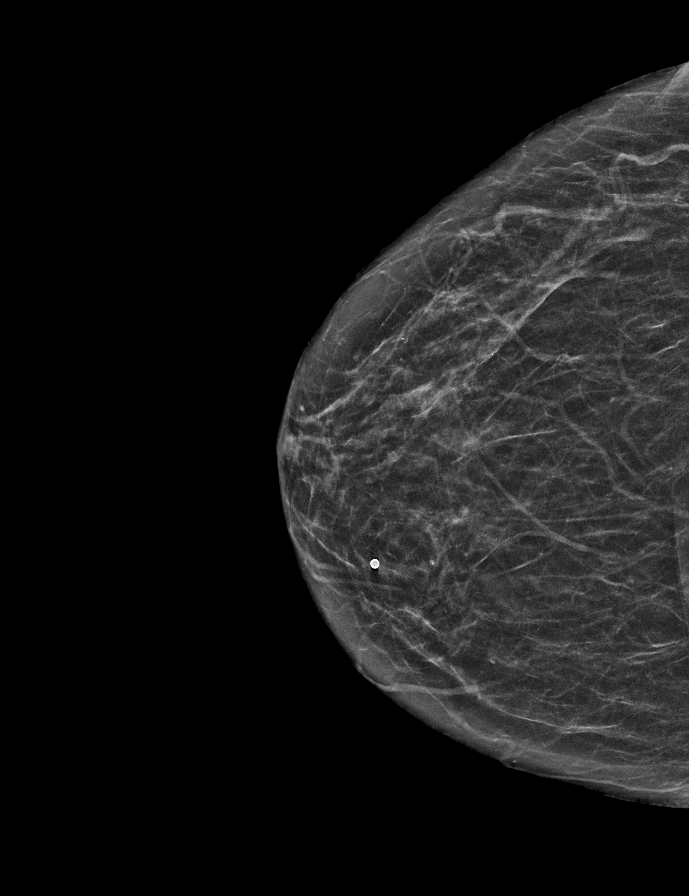

[L MLO synth-2D]
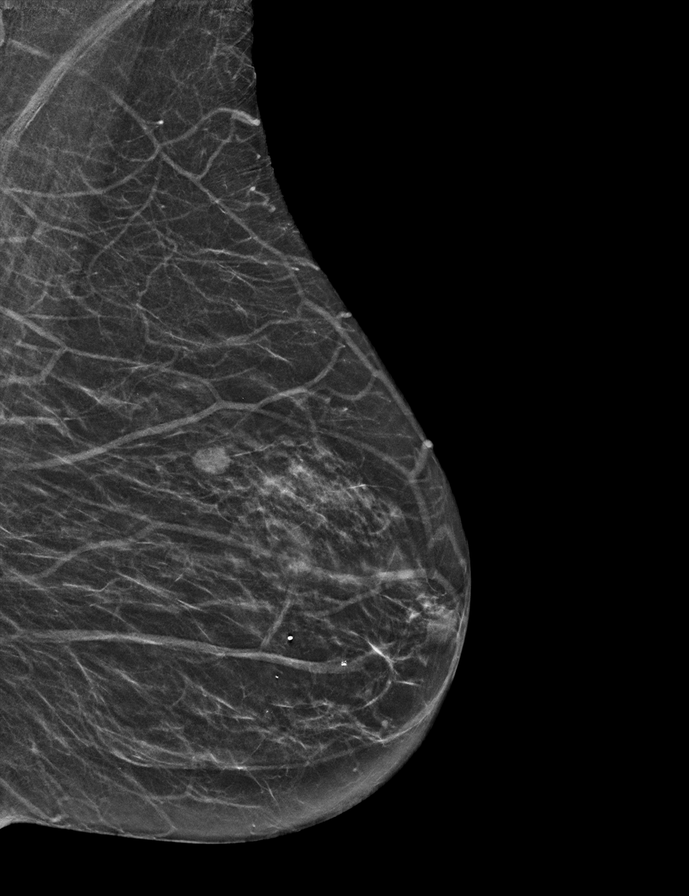

[R MLO synth-2D]
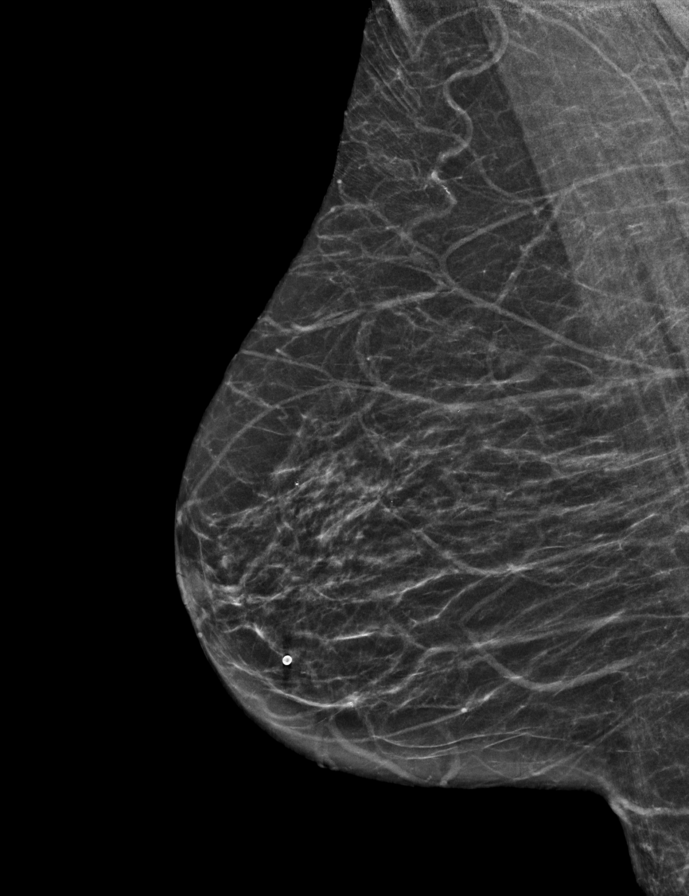

[R CC tomo · 2 of 43 frames shown]
[frame 14/43]
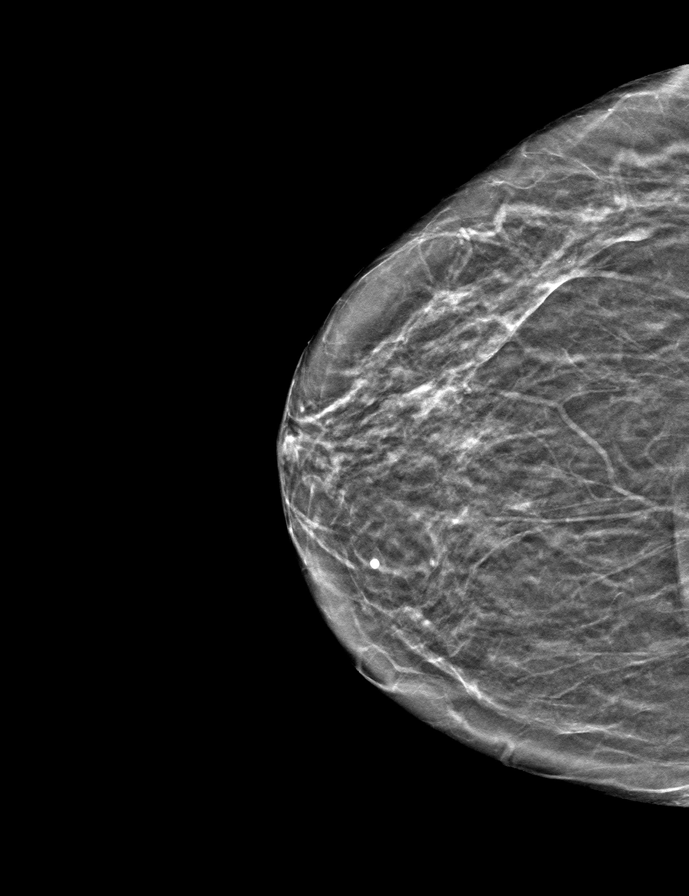
[frame 22/43]
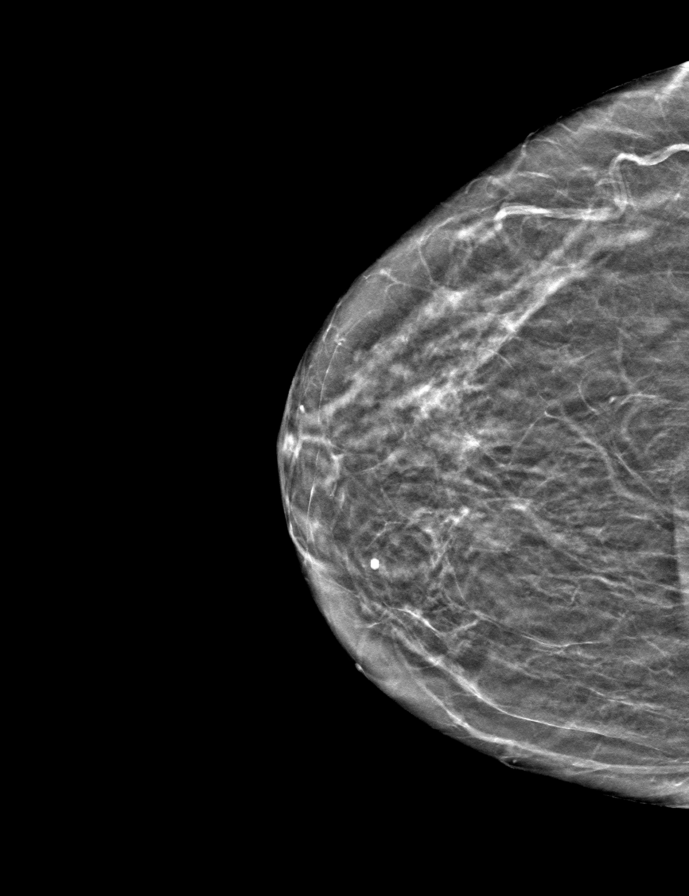

[R MLO tomo · tomo slice 23/45.0]
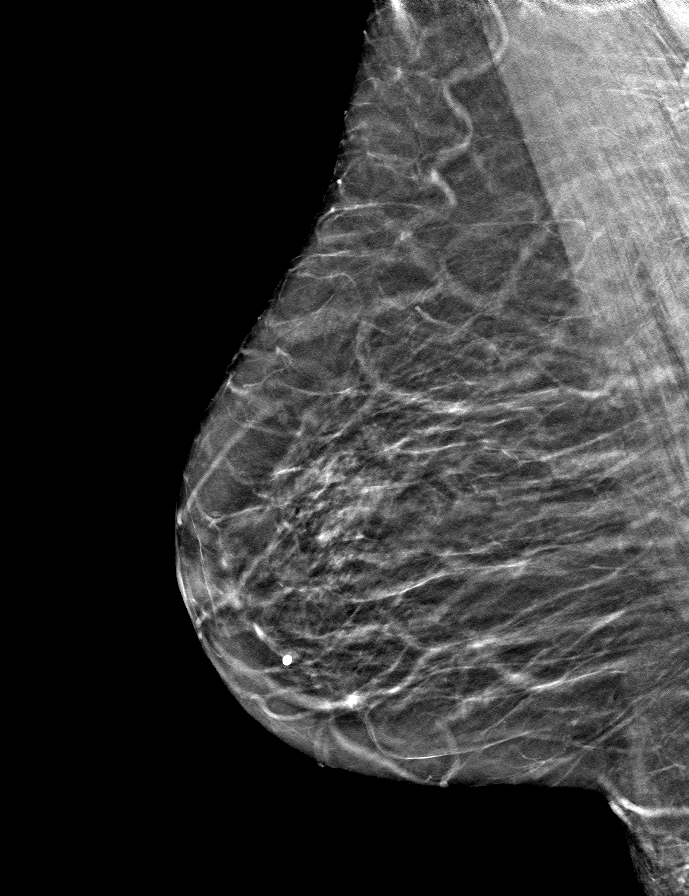

[L CC tomo · tomo slice 19/38.0]
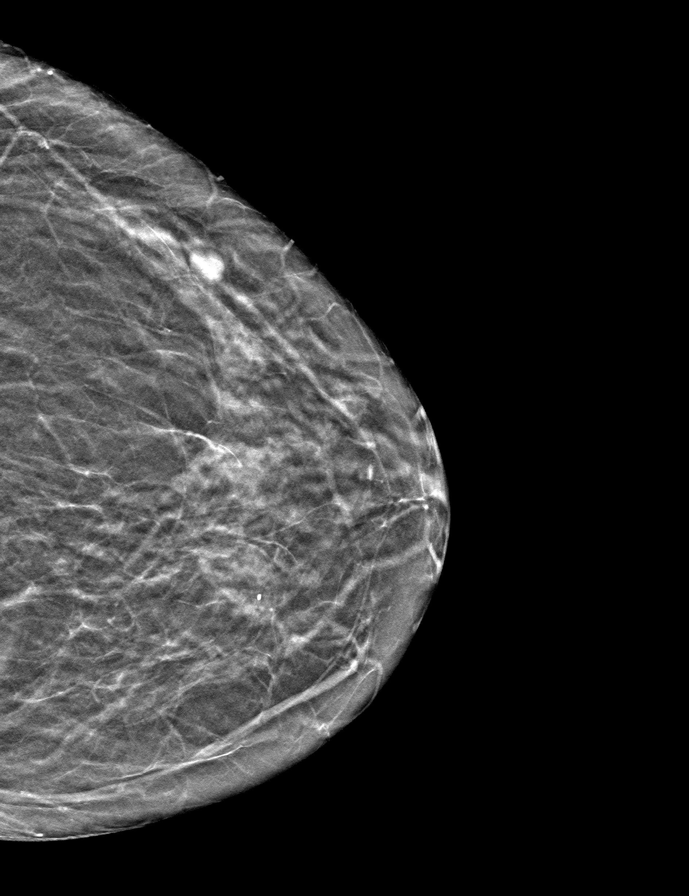

[L MLO tomo · tomo slice 20/39.0]
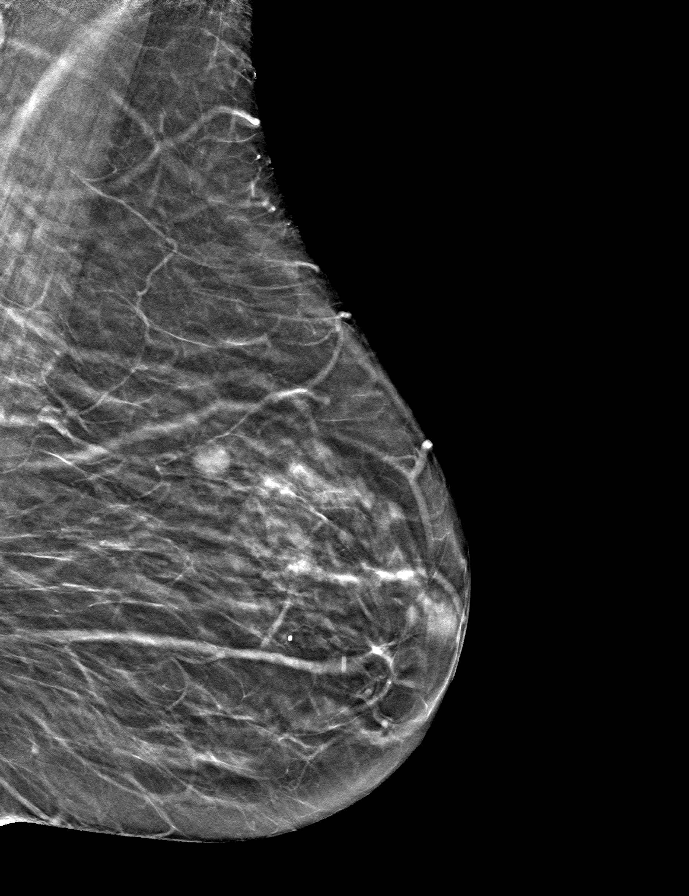

[9 of 24 positions shown; findings below may reference images not displayed]

ACR Breast Density Category b: There are scattered areas of
fibroglandular density.
FINDINGS: There are no findings suspicious for malignancy.
IMPRESSION: No mammographic evidence of malignancy. A result letter of this
screening mammogram will be mailed directly to the patient.

RECOMMENDATION:
Screening mammogram in one year. (Code:51-O-LD2)

BI-RADS CATEGORY  1: Negative.

## 2023-04-23 DIAGNOSIS — E440 Moderate protein-calorie malnutrition: Secondary | ICD-10-CM | POA: Diagnosis not present

## 2023-04-23 DIAGNOSIS — Z299 Encounter for prophylactic measures, unspecified: Secondary | ICD-10-CM | POA: Diagnosis not present

## 2023-04-23 DIAGNOSIS — M549 Dorsalgia, unspecified: Secondary | ICD-10-CM | POA: Diagnosis not present

## 2023-04-23 DIAGNOSIS — J9611 Chronic respiratory failure with hypoxia: Secondary | ICD-10-CM | POA: Diagnosis not present

## 2023-07-22 DIAGNOSIS — M549 Dorsalgia, unspecified: Secondary | ICD-10-CM | POA: Diagnosis not present

## 2023-07-22 DIAGNOSIS — Z299 Encounter for prophylactic measures, unspecified: Secondary | ICD-10-CM | POA: Diagnosis not present

## 2023-07-22 DIAGNOSIS — G43909 Migraine, unspecified, not intractable, without status migrainosus: Secondary | ICD-10-CM | POA: Diagnosis not present

## 2023-07-22 DIAGNOSIS — J9611 Chronic respiratory failure with hypoxia: Secondary | ICD-10-CM | POA: Diagnosis not present

## 2023-12-11 DIAGNOSIS — R5383 Other fatigue: Secondary | ICD-10-CM | POA: Diagnosis not present

## 2023-12-11 DIAGNOSIS — E7800 Pure hypercholesterolemia, unspecified: Secondary | ICD-10-CM | POA: Diagnosis not present

## 2023-12-11 DIAGNOSIS — Z299 Encounter for prophylactic measures, unspecified: Secondary | ICD-10-CM | POA: Diagnosis not present

## 2023-12-11 DIAGNOSIS — F17210 Nicotine dependence, cigarettes, uncomplicated: Secondary | ICD-10-CM | POA: Diagnosis not present

## 2023-12-11 DIAGNOSIS — Z7189 Other specified counseling: Secondary | ICD-10-CM | POA: Diagnosis not present

## 2023-12-11 DIAGNOSIS — Z0000 Encounter for general adult medical examination without abnormal findings: Secondary | ICD-10-CM | POA: Diagnosis not present

## 2023-12-11 DIAGNOSIS — Z79899 Other long term (current) drug therapy: Secondary | ICD-10-CM | POA: Diagnosis not present

## 2023-12-15 DIAGNOSIS — M4316 Spondylolisthesis, lumbar region: Secondary | ICD-10-CM | POA: Diagnosis not present

## 2023-12-15 DIAGNOSIS — M25551 Pain in right hip: Secondary | ICD-10-CM | POA: Diagnosis not present

## 2023-12-15 DIAGNOSIS — M545 Low back pain, unspecified: Secondary | ICD-10-CM | POA: Diagnosis not present

## 2023-12-15 DIAGNOSIS — M47816 Spondylosis without myelopathy or radiculopathy, lumbar region: Secondary | ICD-10-CM | POA: Diagnosis not present

## 2023-12-15 DIAGNOSIS — R52 Pain, unspecified: Secondary | ICD-10-CM | POA: Diagnosis not present

## 2023-12-15 DIAGNOSIS — M1611 Unilateral primary osteoarthritis, right hip: Secondary | ICD-10-CM | POA: Diagnosis not present

## 2024-01-08 DIAGNOSIS — Z1211 Encounter for screening for malignant neoplasm of colon: Secondary | ICD-10-CM | POA: Diagnosis not present

## 2024-01-08 DIAGNOSIS — Z1212 Encounter for screening for malignant neoplasm of rectum: Secondary | ICD-10-CM | POA: Diagnosis not present
